# Patient Record
Sex: Female | Born: 1965
Health system: Southern US, Community
[De-identification: ages and names within clinical notes are randomized; demographics above are authoritative.]

## PROBLEM LIST (undated history)

## (undated) DIAGNOSIS — R0600 Dyspnea, unspecified: Secondary | ICD-10-CM

## (undated) DIAGNOSIS — J479 Bronchiectasis, uncomplicated: Secondary | ICD-10-CM

## (undated) DIAGNOSIS — Z8489 Family history of other specified conditions: Secondary | ICD-10-CM

## (undated) DIAGNOSIS — B441 Other pulmonary aspergillosis: Secondary | ICD-10-CM

## (undated) DIAGNOSIS — Z9289 Personal history of other medical treatment: Secondary | ICD-10-CM

## (undated) DIAGNOSIS — G43909 Migraine, unspecified, not intractable, without status migrainosus: Secondary | ICD-10-CM

## (undated) DIAGNOSIS — J189 Pneumonia, unspecified organism: Secondary | ICD-10-CM

## (undated) DIAGNOSIS — A318 Other mycobacterial infections: Secondary | ICD-10-CM

## (undated) HISTORY — PX: COLONOSCOPY: SHX174

## (undated) HISTORY — PX: WISDOM TOOTH EXTRACTION: SHX21

## (undated) HISTORY — DX: Migraine, unspecified, not intractable, without status migrainosus: G43.909

## (undated) HISTORY — DX: Personal history of other medical treatment: Z92.89

## (undated) HISTORY — PX: OTHER SURGICAL HISTORY: SHX169

---
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
## 2005-03-25 ENCOUNTER — Ambulatory Visit: Payer: Self-pay | Admitting: Internal Medicine

## 2013-07-25 DIAGNOSIS — Z9289 Personal history of other medical treatment: Secondary | ICD-10-CM

## 2013-07-25 HISTORY — DX: Personal history of other medical treatment: Z92.89

## 2016-01-29 DIAGNOSIS — D225 Melanocytic nevi of trunk: Secondary | ICD-10-CM | POA: Diagnosis not present

## 2016-08-23 DIAGNOSIS — G43909 Migraine, unspecified, not intractable, without status migrainosus: Secondary | ICD-10-CM

## 2016-08-23 HISTORY — DX: Migraine, unspecified, not intractable, without status migrainosus: G43.909

## 2016-09-16 DIAGNOSIS — Z01419 Encounter for gynecological examination (general) (routine) without abnormal findings: Secondary | ICD-10-CM | POA: Diagnosis not present

## 2016-09-16 DIAGNOSIS — Z1239 Encounter for other screening for malignant neoplasm of breast: Secondary | ICD-10-CM | POA: Diagnosis not present

## 2016-09-16 DIAGNOSIS — Z1211 Encounter for screening for malignant neoplasm of colon: Secondary | ICD-10-CM | POA: Diagnosis not present

## 2016-09-16 DIAGNOSIS — Z3009 Encounter for other general counseling and advice on contraception: Secondary | ICD-10-CM | POA: Diagnosis not present

## 2016-11-26 ENCOUNTER — Other Ambulatory Visit: Payer: Self-pay | Admitting: Obstetrics and Gynecology

## 2017-01-27 DIAGNOSIS — D2372 Other benign neoplasm of skin of left lower limb, including hip: Secondary | ICD-10-CM | POA: Diagnosis not present

## 2017-09-21 DIAGNOSIS — L301 Dyshidrosis [pompholyx]: Secondary | ICD-10-CM | POA: Diagnosis not present

## 2017-09-22 DIAGNOSIS — M47812 Spondylosis without myelopathy or radiculopathy, cervical region: Secondary | ICD-10-CM | POA: Diagnosis not present

## 2017-09-22 DIAGNOSIS — M5412 Radiculopathy, cervical region: Secondary | ICD-10-CM | POA: Diagnosis not present

## 2017-09-29 DIAGNOSIS — M5413 Radiculopathy, cervicothoracic region: Secondary | ICD-10-CM | POA: Diagnosis not present

## 2017-09-29 DIAGNOSIS — M50323 Other cervical disc degeneration at C6-C7 level: Secondary | ICD-10-CM | POA: Diagnosis not present

## 2017-09-29 DIAGNOSIS — M4722 Other spondylosis with radiculopathy, cervical region: Secondary | ICD-10-CM | POA: Diagnosis not present

## 2017-09-29 DIAGNOSIS — M50322 Other cervical disc degeneration at C5-C6 level: Secondary | ICD-10-CM | POA: Diagnosis not present

## 2017-10-03 DIAGNOSIS — M5413 Radiculopathy, cervicothoracic region: Secondary | ICD-10-CM | POA: Diagnosis not present

## 2017-10-03 DIAGNOSIS — M50323 Other cervical disc degeneration at C6-C7 level: Secondary | ICD-10-CM | POA: Diagnosis not present

## 2017-10-03 DIAGNOSIS — M50322 Other cervical disc degeneration at C5-C6 level: Secondary | ICD-10-CM | POA: Diagnosis not present

## 2017-10-03 DIAGNOSIS — M4722 Other spondylosis with radiculopathy, cervical region: Secondary | ICD-10-CM | POA: Diagnosis not present

## 2017-10-06 DIAGNOSIS — M4722 Other spondylosis with radiculopathy, cervical region: Secondary | ICD-10-CM | POA: Diagnosis not present

## 2017-10-06 DIAGNOSIS — M5413 Radiculopathy, cervicothoracic region: Secondary | ICD-10-CM | POA: Diagnosis not present

## 2017-10-06 DIAGNOSIS — M50323 Other cervical disc degeneration at C6-C7 level: Secondary | ICD-10-CM | POA: Diagnosis not present

## 2017-10-06 DIAGNOSIS — M50322 Other cervical disc degeneration at C5-C6 level: Secondary | ICD-10-CM | POA: Diagnosis not present

## 2017-10-14 DIAGNOSIS — M50323 Other cervical disc degeneration at C6-C7 level: Secondary | ICD-10-CM | POA: Diagnosis not present

## 2017-10-14 DIAGNOSIS — M4722 Other spondylosis with radiculopathy, cervical region: Secondary | ICD-10-CM | POA: Diagnosis not present

## 2017-10-14 DIAGNOSIS — M50322 Other cervical disc degeneration at C5-C6 level: Secondary | ICD-10-CM | POA: Diagnosis not present

## 2017-10-14 DIAGNOSIS — M5413 Radiculopathy, cervicothoracic region: Secondary | ICD-10-CM | POA: Diagnosis not present

## 2017-10-19 DIAGNOSIS — M5413 Radiculopathy, cervicothoracic region: Secondary | ICD-10-CM | POA: Diagnosis not present

## 2017-10-19 DIAGNOSIS — M50322 Other cervical disc degeneration at C5-C6 level: Secondary | ICD-10-CM | POA: Diagnosis not present

## 2017-10-19 DIAGNOSIS — M4722 Other spondylosis with radiculopathy, cervical region: Secondary | ICD-10-CM | POA: Diagnosis not present

## 2017-10-19 DIAGNOSIS — M50323 Other cervical disc degeneration at C6-C7 level: Secondary | ICD-10-CM | POA: Diagnosis not present

## 2017-10-25 DIAGNOSIS — M5413 Radiculopathy, cervicothoracic region: Secondary | ICD-10-CM | POA: Diagnosis not present

## 2017-10-25 DIAGNOSIS — M4722 Other spondylosis with radiculopathy, cervical region: Secondary | ICD-10-CM | POA: Diagnosis not present

## 2017-10-25 DIAGNOSIS — M50323 Other cervical disc degeneration at C6-C7 level: Secondary | ICD-10-CM | POA: Diagnosis not present

## 2017-10-25 DIAGNOSIS — M50322 Other cervical disc degeneration at C5-C6 level: Secondary | ICD-10-CM | POA: Diagnosis not present

## 2017-11-04 DIAGNOSIS — M50323 Other cervical disc degeneration at C6-C7 level: Secondary | ICD-10-CM | POA: Diagnosis not present

## 2017-11-04 DIAGNOSIS — M50322 Other cervical disc degeneration at C5-C6 level: Secondary | ICD-10-CM | POA: Diagnosis not present

## 2017-11-04 DIAGNOSIS — M4722 Other spondylosis with radiculopathy, cervical region: Secondary | ICD-10-CM | POA: Diagnosis not present

## 2017-11-04 DIAGNOSIS — M5413 Radiculopathy, cervicothoracic region: Secondary | ICD-10-CM | POA: Diagnosis not present

## 2017-11-18 DIAGNOSIS — M50322 Other cervical disc degeneration at C5-C6 level: Secondary | ICD-10-CM | POA: Diagnosis not present

## 2017-11-18 DIAGNOSIS — M50323 Other cervical disc degeneration at C6-C7 level: Secondary | ICD-10-CM | POA: Diagnosis not present

## 2017-11-18 DIAGNOSIS — M4722 Other spondylosis with radiculopathy, cervical region: Secondary | ICD-10-CM | POA: Diagnosis not present

## 2017-11-18 DIAGNOSIS — M5413 Radiculopathy, cervicothoracic region: Secondary | ICD-10-CM | POA: Diagnosis not present

## 2017-12-08 DIAGNOSIS — M50323 Other cervical disc degeneration at C6-C7 level: Secondary | ICD-10-CM | POA: Diagnosis not present

## 2017-12-08 DIAGNOSIS — M4722 Other spondylosis with radiculopathy, cervical region: Secondary | ICD-10-CM | POA: Diagnosis not present

## 2017-12-08 DIAGNOSIS — M50322 Other cervical disc degeneration at C5-C6 level: Secondary | ICD-10-CM | POA: Diagnosis not present

## 2017-12-08 DIAGNOSIS — M5413 Radiculopathy, cervicothoracic region: Secondary | ICD-10-CM | POA: Diagnosis not present

## 2017-12-14 DIAGNOSIS — Z0000 Encounter for general adult medical examination without abnormal findings: Secondary | ICD-10-CM | POA: Diagnosis not present

## 2017-12-21 DIAGNOSIS — Z0000 Encounter for general adult medical examination without abnormal findings: Secondary | ICD-10-CM | POA: Diagnosis not present

## 2017-12-22 DIAGNOSIS — M4722 Other spondylosis with radiculopathy, cervical region: Secondary | ICD-10-CM | POA: Diagnosis not present

## 2017-12-22 DIAGNOSIS — M5413 Radiculopathy, cervicothoracic region: Secondary | ICD-10-CM | POA: Diagnosis not present

## 2017-12-22 DIAGNOSIS — M50323 Other cervical disc degeneration at C6-C7 level: Secondary | ICD-10-CM | POA: Diagnosis not present

## 2017-12-22 DIAGNOSIS — M50322 Other cervical disc degeneration at C5-C6 level: Secondary | ICD-10-CM | POA: Diagnosis not present

## 2018-01-05 DIAGNOSIS — Z1211 Encounter for screening for malignant neoplasm of colon: Secondary | ICD-10-CM | POA: Diagnosis not present

## 2018-01-05 DIAGNOSIS — Z01818 Encounter for other preprocedural examination: Secondary | ICD-10-CM | POA: Diagnosis not present

## 2018-02-02 DIAGNOSIS — X32XXXA Exposure to sunlight, initial encounter: Secondary | ICD-10-CM | POA: Diagnosis not present

## 2018-02-02 DIAGNOSIS — L814 Other melanin hyperpigmentation: Secondary | ICD-10-CM | POA: Diagnosis not present

## 2018-02-02 DIAGNOSIS — D485 Neoplasm of uncertain behavior of skin: Secondary | ICD-10-CM | POA: Diagnosis not present

## 2018-02-02 DIAGNOSIS — D2372 Other benign neoplasm of skin of left lower limb, including hip: Secondary | ICD-10-CM | POA: Diagnosis not present

## 2018-03-16 DIAGNOSIS — K5730 Diverticulosis of large intestine without perforation or abscess without bleeding: Secondary | ICD-10-CM | POA: Diagnosis not present

## 2018-03-16 DIAGNOSIS — Z1211 Encounter for screening for malignant neoplasm of colon: Secondary | ICD-10-CM | POA: Diagnosis not present

## 2018-03-16 DIAGNOSIS — K640 First degree hemorrhoids: Secondary | ICD-10-CM | POA: Diagnosis not present

## 2018-03-16 DIAGNOSIS — K648 Other hemorrhoids: Secondary | ICD-10-CM | POA: Diagnosis not present

## 2018-04-13 DIAGNOSIS — L814 Other melanin hyperpigmentation: Secondary | ICD-10-CM | POA: Diagnosis not present

## 2018-04-13 DIAGNOSIS — L578 Other skin changes due to chronic exposure to nonionizing radiation: Secondary | ICD-10-CM | POA: Diagnosis not present

## 2018-04-13 DIAGNOSIS — D229 Melanocytic nevi, unspecified: Secondary | ICD-10-CM | POA: Diagnosis not present

## 2018-04-13 DIAGNOSIS — D2272 Melanocytic nevi of left lower limb, including hip: Secondary | ICD-10-CM | POA: Diagnosis not present

## 2018-04-13 DIAGNOSIS — L988 Other specified disorders of the skin and subcutaneous tissue: Secondary | ICD-10-CM | POA: Diagnosis not present

## 2018-06-02 DIAGNOSIS — N390 Urinary tract infection, site not specified: Secondary | ICD-10-CM | POA: Diagnosis not present

## 2018-06-02 DIAGNOSIS — R300 Dysuria: Secondary | ICD-10-CM | POA: Diagnosis not present

## 2018-06-09 ENCOUNTER — Encounter: Payer: Self-pay | Admitting: Obstetrics and Gynecology

## 2018-06-09 ENCOUNTER — Ambulatory Visit (INDEPENDENT_AMBULATORY_CARE_PROVIDER_SITE_OTHER): Payer: BLUE CROSS/BLUE SHIELD | Admitting: Obstetrics and Gynecology

## 2018-06-09 ENCOUNTER — Other Ambulatory Visit (HOSPITAL_COMMUNITY)
Admission: RE | Admit: 2018-06-09 | Discharge: 2018-06-09 | Disposition: A | Discharge status: Home / Self Care | Payer: BLUE CROSS/BLUE SHIELD | Source: Ambulatory Visit | Attending: Obstetrics and Gynecology | Admitting: Obstetrics and Gynecology

## 2018-06-09 VITALS — BP 126/70 | HR 77 | Ht 66.0 in | Wt 132.0 lb

## 2018-06-09 DIAGNOSIS — Z124 Encounter for screening for malignant neoplasm of cervix: Secondary | ICD-10-CM

## 2018-06-09 DIAGNOSIS — N898 Other specified noninflammatory disorders of vagina: Secondary | ICD-10-CM

## 2018-06-09 DIAGNOSIS — Z1151 Encounter for screening for human papillomavirus (HPV): Secondary | ICD-10-CM | POA: Insufficient documentation

## 2018-06-09 DIAGNOSIS — Z1239 Encounter for other screening for malignant neoplasm of breast: Secondary | ICD-10-CM

## 2018-06-09 DIAGNOSIS — Z01411 Encounter for gynecological examination (general) (routine) with abnormal findings: Secondary | ICD-10-CM | POA: Diagnosis not present

## 2018-06-09 DIAGNOSIS — Z01419 Encounter for gynecological examination (general) (routine) without abnormal findings: Secondary | ICD-10-CM | POA: Diagnosis not present

## 2018-06-09 DIAGNOSIS — N951 Menopausal and female climacteric states: Secondary | ICD-10-CM

## 2018-06-09 DIAGNOSIS — Z87440 Personal history of urinary (tract) infections: Secondary | ICD-10-CM

## 2018-06-09 MED ORDER — FLUCONAZOLE 150 MG PO TABS: 150.0000 mg | ORAL_TABLET | Freq: Once | ORAL | 0 refills | Status: AC

## 2018-06-09 NOTE — Progress Notes (Signed)
PCP: Raynelle Bring   Chief Complaint  Patient presents with  . Gynecologic Exam    just finished treatment for UTI, still having some itching and external irritation, no burning    HPI:      [name] is a 52 y.o. No obstetric history on file. who LMP was Patient's last menstrual period was 05/29/2018 (exact date)., presents today for her annual examination.  Her menses are regular every 28-30 days, lasting 4 days.  Dysmenorrhea mild, occurring first 1-2 days of flow. She does not have intermenstrual bleeding. She does not have vasomotor sx. Stopped OCPs 2 yrs ago and doing well. Migraines resolved.   Sex activity: single partner, contraception - condoms. She does not have vaginal dryness.  She has had ext vag irritation for several wks with mild d/c, no odor. Then developed dysuria and treated with macrobid for UTI recently. C&S showed GBS but since pt's sx resolved, not treated with PCN. Pt still has ext irritation. Uses "natural soap". No yeast meds to treat.   Last Pap: June 23, 2015  Results were: ASCUS with NEGATIVE high risk HPV /neg HPV DNA. Repeat due today Hx of STDs: none  Last mammogram: August 01, 2015  Results were: normal--routine follow-up in 12 months There is no FH of breast cancer. There is no FH of ovarian cancer. The patient does do self-breast exams.  Colonoscopy: 8/19  Repeat due after 10 years.   Tobacco use: The patient denies current or previous tobacco use. Alcohol use: none Exercise: moderately active  She does get adequate calcium and Vitamin D in her diet.  Labs with PCP.   Past Medical History:  Diagnosis Date  . History of mammogram 07/25/2013   BIRAD 2; 08/01/15 neg  . Migraine 08/2016   with aura;now also optic migraines    Past Surgical History:  Procedure Laterality Date  . CESAREAN SECTION  1993  . Tummy tuck      Family History  Problem Relation Age of Onset  . Heart disease Father   . Lung cancer  Paternal Grandfather     Social History   Socioeconomic History  . Marital status: Married    Spouse name: Not on file  . Number of children: Not on file  . Years of education: Not on file  . Highest education level: Not on file  Occupational History  . Not on file  Social Needs  . Financial resource strain: Not on file  . Food insecurity:    Worry: Not on file    Inability: Not on file  . Transportation needs:    Medical: Not on file    Non-medical: Not on file  Tobacco Use  . Smoking status: Never Smoker  . Smokeless tobacco: Never Used  Substance and Sexual Activity  . Alcohol use: Yes    Comment: rarely  . Drug use: Never  . Sexual activity: Yes    Birth control/protection: Condom  Lifestyle  . Physical activity:    Days per week: Not on file    Minutes per session: Not on file  . Stress: Not on file  Relationships  . Social connections:    Talks on phone: Not on file    Gets together: Not on file    Attends religious service: Not on file    Active member of club or organization: Not on file    Attends meetings of clubs or organizations: Not on file    Relationship status: Not on file  .  Intimate partner violence:    Fear of current or ex partner: Not on file    Emotionally abused: Not on file    Physically abused: Not on file    Forced sexual activity: Not on file  Other Topics Concern  . Not on file  Social History Narrative  . Not on file    Outpatient Medications Prior to Visit  Medication Sig Dispense Refill  . Cholecalciferol (VITAMIN D-1000 MAX ST) 1000 units tablet Take by mouth.    . Cyanocobalamin 2500 MCG CHEW Chew by mouth.     No facility-administered medications prior to visit.     ROS:  Review of Systems  Constitutional: Negative for fatigue, fever and unexpected weight change.  Respiratory: Negative for cough, shortness of breath and wheezing.   Cardiovascular: Negative for chest pain, palpitations and leg swelling.    Gastrointestinal: Negative for blood in stool, constipation, diarrhea, nausea and vomiting.  Endocrine: Negative for cold intolerance, heat intolerance and polyuria.  Genitourinary: Positive for vaginal discharge. Negative for dyspareunia, dysuria, flank pain, frequency, genital sores, hematuria, menstrual problem, pelvic pain, urgency, vaginal bleeding and vaginal pain.  Musculoskeletal: Negative for back pain, joint swelling and myalgias.  Skin: Negative for rash.  Neurological: Negative for dizziness, syncope, light-headedness, numbness and headaches.  Hematological: Negative for adenopathy.  Psychiatric/Behavioral: Negative for agitation, confusion, sleep disturbance and suicidal ideas. The patient is not nervous/anxious.    BREAST: No symptoms    Objective: BP 126/70   Pulse 77   Ht 5\' 6"  (1.676 m)   Wt 132 lb (59.9 kg)   LMP 05/29/2018 (Exact Date)   BMI 21.31 kg/m    Physical Exam  Constitutional: She is oriented to person, place, and time. She appears well-developed and well-nourished.  Genitourinary: Vagina normal and uterus normal. There is no rash or tenderness on the right labia. There is no rash or tenderness on the left labia. No erythema or tenderness in the vagina. No vaginal discharge found. Right adnexum does not display mass and does not display tenderness. Left adnexum does not display mass and does not display tenderness. Cervix does not exhibit motion tenderness or polyp. Uterus is not enlarged or tender.  Neck: Normal range of motion. No thyromegaly present.  Cardiovascular: Normal rate, regular rhythm and normal heart sounds.  No murmur heard. Pulmonary/Chest: Effort normal and breath sounds normal. Right breast exhibits no mass, no nipple discharge, no skin change and no tenderness. Left breast exhibits no mass, no nipple discharge, no skin change and no tenderness.  Abdominal: Soft. There is no tenderness. There is no guarding.  Musculoskeletal: Normal range  of motion.  Neurological: She is alert and oriented to person, place, and time. No cranial nerve deficit.  Psychiatric: She has a normal mood and affect. Her behavior is normal.  Vitals reviewed.   Assessment/Plan:  Encounter for annual routine gynecological examination  Cervical cancer screening - Plan: Cytology - PAP  Screening for HPV (human papillomavirus) - Plan: Cytology - PAP  Screening for breast cancer - Pt to scehd mammo - Plan: MM 3D SCREEN BREAST BILATERAL  Vaginal itching - Neg exam. Rx diflucan empirically since on abx recently. Dove sens skin soap. - Plan: fluconazole (DIFLUCAN) 150 MG tablet  Hx: UTI (urinary tract infection) - Pt had GBS on C&S. Treated with macrobid with sx relief. If sx recur, will treat with PCN.   Perimenopause - F/u prn DUB   Meds ordered this encounter  Medications  . fluconazole (DIFLUCAN) 150  MG tablet    Sig: Take 1 tablet (150 mg total) by mouth once for 1 dose.    Dispense:  1 tablet    Refill:  0    Order Specific Question:   Supervising Provider    Answer:   Nadara Mustard [[id number]]            GYN counsel breast self exam, mammography screening, menopause, adequate intake of calcium and vitamin D, diet and exercise    F/U  Return in about 1 year (around 06/10/2019).  Alix Stowers B. Anyely Cunning, PA-C 06/09/2018 12:14 PM

## 2018-06-09 NOTE — Patient Instructions (Signed)
I value your feedback and entrusting us with your care. If you get a [facility] patient survey, I would appreciate you taking the time to let us know about your experience today. Thank you! 

## 2018-06-14 LAB — CYTOLOGY - PAP
Diagnosis: NEGATIVE
HPV (WINDOPATH): NOT DETECTED

## 2018-06-23 DIAGNOSIS — J479 Bronchiectasis, uncomplicated: Secondary | ICD-10-CM

## 2018-06-23 HISTORY — DX: Bronchiectasis, uncomplicated: J47.9

## 2018-07-05 ENCOUNTER — Ambulatory Visit
Admission: RE | Admit: 2018-07-05 | Discharge: 2018-07-05 | Disposition: A | Discharge status: Home / Self Care | Payer: BLUE CROSS/BLUE SHIELD | Source: Ambulatory Visit | Attending: Obstetrics and Gynecology | Admitting: Obstetrics and Gynecology

## 2018-07-05 DIAGNOSIS — Z1231 Encounter for screening mammogram for malignant neoplasm of breast: Secondary | ICD-10-CM | POA: Diagnosis not present

## 2018-07-05 DIAGNOSIS — Z1239 Encounter for other screening for malignant neoplasm of breast: Secondary | ICD-10-CM | POA: Diagnosis not present

## 2018-07-11 ENCOUNTER — Encounter: Payer: Self-pay | Admitting: Obstetrics and Gynecology

## 2018-07-12 DIAGNOSIS — J209 Acute bronchitis, unspecified: Secondary | ICD-10-CM | POA: Diagnosis not present

## 2018-08-08 DIAGNOSIS — J189 Pneumonia, unspecified organism: Secondary | ICD-10-CM | POA: Diagnosis not present

## 2018-08-09 ENCOUNTER — Ambulatory Visit
Admission: RE | Admit: 2018-08-09 | Discharge: 2018-08-09 | Disposition: A | Discharge status: Home / Self Care | Payer: BLUE CROSS/BLUE SHIELD | Source: Ambulatory Visit | Attending: Family Medicine | Admitting: Family Medicine

## 2018-08-09 ENCOUNTER — Other Ambulatory Visit: Payer: Self-pay | Admitting: Family Medicine

## 2018-08-09 DIAGNOSIS — R918 Other nonspecific abnormal finding of lung field: Secondary | ICD-10-CM | POA: Diagnosis not present

## 2018-08-09 DIAGNOSIS — J189 Pneumonia, unspecified organism: Secondary | ICD-10-CM | POA: Diagnosis not present

## 2018-08-09 DIAGNOSIS — J984 Other disorders of lung: Secondary | ICD-10-CM

## 2018-08-09 MED ORDER — IOHEXOL 300 MG/ML  SOLN
75.0000 mL | Freq: Once | INTRAMUSCULAR | Status: AC | PRN
  Administered 2018-08-09: 75 mL via INTRAVENOUS

## 2018-08-10 ENCOUNTER — Other Ambulatory Visit: Payer: Self-pay | Admitting: Family Medicine

## 2018-08-10 DIAGNOSIS — R160 Hepatomegaly, not elsewhere classified: Secondary | ICD-10-CM

## 2018-08-10 DIAGNOSIS — K7689 Other specified diseases of liver: Secondary | ICD-10-CM

## 2018-08-15 ENCOUNTER — Other Ambulatory Visit: Payer: Self-pay | Admitting: Family Medicine

## 2018-08-15 DIAGNOSIS — K769 Liver disease, unspecified: Secondary | ICD-10-CM

## 2018-08-15 DIAGNOSIS — R160 Hepatomegaly, not elsewhere classified: Secondary | ICD-10-CM

## 2018-08-17 DIAGNOSIS — R05 Cough: Secondary | ICD-10-CM | POA: Diagnosis not present

## 2018-08-25 ENCOUNTER — Other Ambulatory Visit: Payer: Self-pay

## 2018-08-25 ENCOUNTER — Ambulatory Visit
Admission: RE | Admit: 2018-08-25 | Discharge: 2018-08-25 | Disposition: A | Discharge status: Home / Self Care | Payer: BLUE CROSS/BLUE SHIELD | Attending: Pulmonary Disease | Admitting: Pulmonary Disease

## 2018-08-25 ENCOUNTER — Encounter: Payer: Self-pay | Admitting: Certified Registered"

## 2018-08-25 ENCOUNTER — Encounter
Admission: RE | Disposition: A | Discharge status: Home / Self Care | Payer: Self-pay | Source: Home / Self Care | Attending: Pulmonary Disease

## 2018-08-25 ENCOUNTER — Encounter: Payer: Self-pay | Admitting: *Deleted

## 2018-08-25 DIAGNOSIS — K769 Liver disease, unspecified: Secondary | ICD-10-CM | POA: Diagnosis not present

## 2018-08-25 DIAGNOSIS — J479 Bronchiectasis, uncomplicated: Secondary | ICD-10-CM | POA: Insufficient documentation

## 2018-08-25 DIAGNOSIS — R071 Chest pain on breathing: Secondary | ICD-10-CM | POA: Diagnosis not present

## 2018-08-25 DIAGNOSIS — J180 Bronchopneumonia, unspecified organism: Secondary | ICD-10-CM | POA: Diagnosis not present

## 2018-08-25 DIAGNOSIS — Z79899 Other long term (current) drug therapy: Secondary | ICD-10-CM | POA: Diagnosis not present

## 2018-08-25 DIAGNOSIS — I700 Atherosclerosis of aorta: Secondary | ICD-10-CM | POA: Diagnosis not present

## 2018-08-25 HISTORY — PX: FLEXIBLE BRONCHOSCOPY: SHX5094

## 2018-08-25 LAB — POCT PREGNANCY, URINE: Preg Test, Ur: NEGATIVE

## 2018-08-25 SURGERY — BRONCHOSCOPY, FLEXIBLE
Anesthesia: Moderate Sedation | Laterality: Bilateral

## 2018-08-25 MED ORDER — LIDOCAINE HCL URETHRAL/MUCOSAL 2 % EX GEL
1.0000 "application " | Freq: Once | CUTANEOUS | Status: DC
  Filled 2018-08-25: qty 30

## 2018-08-25 MED ORDER — FENTANYL CITRATE (PF) 100 MCG/2ML IJ SOLN
INTRAMUSCULAR | Status: AC
  Filled 2018-08-25: qty 4

## 2018-08-25 MED ORDER — MIDAZOLAM HCL 2 MG/2ML IJ SOLN
INTRAMUSCULAR | Status: AC
  Filled 2018-08-25: qty 4

## 2018-08-25 MED ORDER — BUTAMBEN-TETRACAINE-BENZOCAINE 2-2-14 % EX AERO
1.0000 | INHALATION_SPRAY | Freq: Once | CUTANEOUS | Status: DC
  Filled 2018-08-25: qty 20

## 2018-08-25 MED ORDER — LIDOCAINE HCL (PF) 1 % IJ SOLN
30.0000 mL | Freq: Once | INTRAMUSCULAR | Status: DC
  Filled 2018-08-25: qty 30

## 2018-08-25 MED ORDER — FENTANYL CITRATE (PF) 100 MCG/2ML IJ SOLN
INTRAMUSCULAR | Status: AC
  Filled 2018-08-25: qty 2

## 2018-08-25 MED ORDER — SODIUM CHLORIDE 0.9 % IV SOLN: INTRAVENOUS | Status: DC | PRN

## 2018-08-25 MED ORDER — PHENYLEPHRINE HCL 0.25 % NA SOLN
1.0000 | Freq: Four times a day (QID) | NASAL | Status: DC | PRN
  Filled 2018-08-25: qty 15

## 2018-08-25 MED ORDER — MIDAZOLAM HCL 2 MG/2ML IJ SOLN
INTRAMUSCULAR | Status: DC | PRN
  Administered 2018-08-25 (×8): 1 mg via INTRAVENOUS
  Administered 2018-08-25: 2 mg via INTRAVENOUS

## 2018-08-25 MED ORDER — MIDAZOLAM HCL 2 MG/2ML IJ SOLN
INTRAMUSCULAR | Status: AC
  Filled 2018-08-25: qty 10

## 2018-08-25 MED ORDER — FENTANYL CITRATE (PF) 100 MCG/2ML IJ SOLN
INTRAMUSCULAR | Status: DC | PRN
  Administered 2018-08-25 (×2): 25 ug via INTRAVENOUS
  Administered 2018-08-25: 50 ug via INTRAVENOUS
  Administered 2018-08-25 (×5): 25 ug via INTRAVENOUS

## 2018-08-25 NOTE — Discharge Instructions (Signed)
Flexible Bronchoscopy, Care After This sheet gives you information about how to care for yourself after your test. Your doctor may also give you more specific instructions. If you have problems or questions, contact your doctor. Follow these instructions at home: Eating and drinking  Do not eat or drink anything (not even water) for 2 hours after your test, or until your numbing medicine (local anesthetic) wears off.  When your numbness is gone and your cough and gag reflexes have come back, you may: ? Eat only soft foods. ? Slowly drink liquids.  The day after the test, go back to your normal diet. Driving  Do not drive for 24 hours if you were given a medicine to help you relax (sedative).  Do not drive or use heavy machinery while taking prescription pain medicine. General instructions   Take over-the-counter and prescription medicines only as told by your doctor.  Return to your normal activities as told. Ask what activities are safe for you.  Do not use any products that have nicotine or tobacco in them. This includes cigarettes and e-cigarettes. If you need help quitting, ask your doctor.  Keep all follow-up visits as told by your doctor. This is important. It is very important if you had a tissue sample (biopsy) taken. Get help right away if:  You have shortness of breath that gets worse.  You get light-headed.  You feel like you are going to pass out (faint).  You have chest pain.  You cough up: ? More than a little blood. ? More blood than before. Summary  Do not eat or drink anything (not even water) for 2 hours after your test, or until your numbing medicine wears off.  Do not use cigarettes. Do not use e-cigarettes.  Get help right away if you have chest pain. This information is not intended to replace advice given to you by your health care provider. Make sure you discuss any questions you have with your health care provider. Document Released: 06/06/2009  Document Revised: 08/27/2016 Document Reviewed: 08/27/2016 Elsevier Interactive Patient Education  2019 [place]   1) The drugs that you were given will stay in your system until tomorrow so for the next 24 hours you should not:  A) Drive an automobile B) Make any legal decisions C) Drink any alcoholic beverage   2) You may resume regular meals tomorrow.  Today it is better to start with liquids and gradually work up to solid foods.  You may eat anything you prefer, but it is better to start with liquids, then soup and crackers, and gradually work up to solid foods.   3) Please notify your doctor immediately if you have any unusual bleeding, trouble breathing, redness and pain at the surgery site, drainage, fever, or pain not relieved by medication.    4) Additional Instructions:        Please contact your physician with any problems or Same Day Surgery at [phone], Monday through Friday 6 am to 4 pm, or [place] at [facility] number at [phone].

## 2018-08-25 NOTE — Procedures (Addendum)
FLEXIBLE BRONCHOSCOPY PROCEDURE NOTE    Flexible bronchoscopy was performed on 08/25/2017  by : Lanney Gins MD  assistance by : 1) Stacy RT  and 2)Lissa RN   Indication for the procedure was :  Pre-procedural H&P. The following assessment was performed on the day of the procedure prior to initiating sedation History:  Chest pain none Dyspnea none Hemoptysis none Cough yes Fever none Other pertinent items none  Examination Vital signs -reviewed as per nursing documentation today Cardiac    Murmurs: none  Rubs : none  Gallop: none Lungs Wheezing: none Rales : none Rhonchi :none  Other pertinent findings: none   Pre-procedural assessment for Procedural Sedation included: Depth of sedation: As per anesthesia team  ASA Classification:  1 Mallampati airway assessment: 2    Medication list reviewed: yes  The patient's interval history was taken and revealed: no new complaints The pre- procedure physical examination revealed: No new findings Refer to prior clinic note for details.  Informed Consent: Informed consent was obtained from:  patient after explanation of procedure and risks, benefits, as well as alternative procedures available.  Explanation of level of sedation and possible transfusion was also provided.    Procedural Preparation: Time out was performed and patient was identified by name and birthdate and procedure to be performed and side for sampling, if any, was specified. Pt was intubated by anesthesia.  The patient was appropriately draped.  Procedure Findings: Bronchoscope was inserted via ETT  without difficulty.  Posterior oropharynx, epiglottis, arytenoids, false cords and vocal cords were not visualized as these were bypassed by endotracheal tube.   The distal trachea was normal in circumference and appearance without mucosal, cartilaginous or branching abnormalities.  The main carina was normal nonsplayed .   All right and left lobar airways  were visualized to the Subsegmental level.  Sub- sub segmental carinae were identified in all the distal airways.   Secretions were visible in the following airways and appeared to be mucopurulent at RML , cloudy at superior and inferior lingular .  The mucosa was : normal  Airways were notable for:        exophytic lesions :none       extrinsic compression in the following distributions: none.       Friable mucosa: mild       Anthrocotic material /pigmentation: none   Pictorial documentation attached: yes      Specimens obtained included:  Bronchial washings site: bilateral  sent for: AFB                                    Broncho-alveolar lavage site:RML and lingular  sent for micro and cytology                             180 ml volume infused 60 ml volume returned with mucopurulent appearance   Immediate sampling complications included:none Epinephrine 25m was used topically  The bronchoscopy was terminated due to completion of the planned procedure and the bronchoscope was removed.   Total dosage of Lidocaine was 4 1%mg Total fluoroscopy time was 0 minutes  Supplemental oxygen was provided at 10 lpm by nasal canula post operatively  Moderate sedation used: Fentanyl 225 mcg,  Versed 10 mg  Estimated Blood loss: 1-5 cc.  Complications included:  none   Disposition: Home with husband  Follow up with Dr.  Raylon Lamson in 10 days for result discussion.     Claudette Stapler MD  [facility] Division of Pulmonary & Critical Care Medicine

## 2018-08-25 NOTE — H&P (Signed)
Pulmonary Medicine H&P         Date: 08/25/2018,   MRN# [id number] Brittney Bailey [birth year]     Admission                  Current  Brittney Bailey is a 53 y.o. old female seen in hospital for procedure post comprehensive evaluation in office     CHIEF COMPLAINT:   Pleuritic chest pain and cough   HISTORY OF PRESENT ILLNESS   This is a pleasant 53yo female who is overall healthy, she is a lifelong nonsmoker.  She was seen by primary care for pneumonia.  She was treated outpatient with typical antimicrobial regimen for CAP but failed therapy.  Patient relates worsening chest pain that is exacerbated with deep breathing and now is causing distress at night when sleeping on side.  She has had cough throuhout this period.  She denies exposure to TB/sick contacts other then common cold.  She was sent to pulmonary evaluation after failing treatment on multiple occasions.  Evaluation of CT chest was significant for bilateral bronchiectaisis worse in right middle lobe and lingular segments.  She was tested for immunoglobulin deficiency and testing was in reference range.  There is concern for active nontubercular mycobacterial infection vs indolent endemic infection and plan is to obtain washings and bronchoalveolar lavage of involved pulmonary segments.     PAST MEDICAL HISTORY   Past Medical History:  Diagnosis Date  . History of mammogram 07/25/2013   BIRAD 2; 08/01/15 neg  . Migraine 08/2016   with aura;now also optic migraines     SURGICAL HISTORY   Past Surgical History:  Procedure Laterality Date  . CESAREAN SECTION  1993  . Tummy tuck       FAMILY HISTORY   Family History  Problem Relation Age of Onset  . Heart disease Father   . Lung cancer Paternal Grandfather      SOCIAL HISTORY   Social History   Tobacco Use  . Smoking status: Never Smoker  . Smokeless tobacco: Never Used  Substance Use Topics  . Alcohol use: Yes    Comment: rarely  . Drug  use: Never     MEDICATIONS    Home Medication:    Current Medication:  Current Facility-Administered Medications:  .  0.9 %  sodium chloride infusion, , Intravenous, PRN, Lanney Gins, Lillith Mcneff, MD .  butamben-tetracaine-benzocaine (CETACAINE) spray 1 spray, 1 spray, Topical, Once, Hodan Wurtz, MD .  lidocaine (PF) (XYLOCAINE) 1 % injection 30 mL, 30 mL, Other, Once, Upton Russey, MD .  lidocaine (XYLOCAINE) 2 % jelly 1 application, 1 application, Topical, Once, Tauno Falotico, MD .  phenylephrine (NEO-SYNEPHRINE) 0.25 % nasal spray 1 spray, 1 spray, Each Nare, Q6H PRN, Ottie Glazier, MD    ALLERGIES   Patient has no known allergies.     REVIEW OF SYSTEMS    Review of Systems:  Gen:  Denies  fever, sweats, chills weigh loss  HEENT: Denies blurred vision, double vision, ear pain, eye pain, hearing loss, nose bleeds, sore throat Cardiac:  No dizziness, chest pain or heaviness, chest tightness,edema Resp:   Denies cough or sputum porduction, shortness of breath,wheezing, hemoptysis,  Gi: Denies swallowing difficulty, stomach pain, nausea or vomiting, diarrhea, constipation, bowel incontinence Gu:  Denies bladder incontinence, burning urine Ext:   Denies Joint pain, stiffness or swelling Skin: Denies  skin rash, easy bruising or bleeding or hives Endoc:  Denies polyuria, polydipsia , polyphagia or weight change  Psych:   Denies depression, insomnia or hallucinations   Other:  All other systems negative   VS: LMP 08/23/2018      PHYSICAL EXAM  Physical Examination:   GENERAL:NAD, no fevers, chills, no weakness no fatigue HEAD: Normocephalic, atraumatic.  EYES: Pupils equal, round, reactive to light. Extraocular muscles intact. No scleral icterus.  MOUTH: Moist mucosal membrane. Dentition intact. No abscess noted.  EAR, NOSE, THROAT: Clear without exudates. No external lesions.  NECK: Supple. No thyromegaly. No nodules. No JVD.  PULMONARY: Clear to auscultation  no wheezing rhonchi or crackles CARDIOVASCULAR: S1 and S2. Regular rate and rhythm. No murmurs, rubs, or gallops. No edema. Pedal pulses 2+ bilaterally.  GASTROINTESTINAL: Soft, nontender, nondistended. No masses. Positive bowel sounds. No hepatosplenomegaly.  MUSCULOSKELETAL: No swelling, clubbing, or edema. Range of motion full in all extremities.  NEUROLOGIC: Cranial nerves II through XII are intact. No gross focal neurological deficits. Sensation intact. Reflexes intact.  SKIN: No ulceration, lesions, rashes, or cyanosis. Skin warm and dry. Turgor intact.  PSYCHIATRIC: Mood, affect within normal limits. The patient is awake, alert and oriented x 3. Insight, judgment intact.       IMAGING    Ct Chest W Contrast  Result Date: 08/09/2018 CLINICAL DATA:  Cough for 3 weeks, chest pain for 2 days. Recent diagnosis of pneumonia. EXAM: CT CHEST WITH CONTRAST TECHNIQUE: Multidetector CT imaging of the chest was performed during intravenous contrast administration. CONTRAST:  16m OMNIPAQUE IOHEXOL 300 MG/ML  SOLN COMPARISON:  CT chest March 25, 2005 FINDINGS: CARDIOVASCULAR: Heart and pericardium are unremarkable. Thoracic aorta is normal course and caliber, trace calcific atherosclerosis. MEDIASTINUM/NODES: New 2 cm posterior mediastinal cystic mass. LUNGS/PLEURA: Tracheobronchial tree is patent, no pneumothorax. Diffuse tree-in-bud infiltrates with basilar predominance. Superimposed subpleural patchy consolidation RIGHT lower lobe. RIGHT middle and lingular chronic atelectasis and bronchiectasis. UPPER ABDOMEN: Nonacute. 19 mm irregular hypodense mass segment 5/6 of the liver. MUSCULOSKELETAL: Nonacute. IMPRESSION: 1. Tree-in-bud infiltrates with superimposed RIGHT middle lobe consolidation, likely infectious. New 2 cm posterior mediastinal cyst, differential diagnosis includes perineural cyst or lymphocele. 2. RIGHT middle lobe and lingular chronic atelectasis and bronchiectasis. 3. **An incidental  finding of potential clinical significance has been found. 19 mm lesion RIGHT lobe of the liver incompletely evaluated. Consider non emergent MRI of the liver. This recommendation follows ACR consensus guidelines: Management of Incidental Liver Lesions on CT: A White Paper of the ACR Incidental Findings Committee. J Am Coll Radiol 2017;14:1429-1437.** 4. These results will be called to the ordering clinician or representative by the Radiology Department at the imaging location. Aortic Atherosclerosis (ICD10-I70.0). Electronically Signed   By: CElon AlasM.D.   On: 08/09/2018 17:55      ASSESSMENT/PLAN   Pleuritic chest pain and bronchiectasis - concern for active indolent infection likely mycobacterium avium complex vs other indolent infection vs inflammatory/malignant etiology - Flexible bronchoscopy with BAL and bronchial washings of involved segments  - invasive specimen to be sent to micro/cyto    Patient/Family are satisfied with Plan of action and management. All questions answered      FOttie Glazier MD Division of Pulmonary and Critical Care Medicine 12:32 PM 08/25/2018

## 2018-08-28 ENCOUNTER — Encounter: Payer: Self-pay | Admitting: Pulmonary Disease

## 2018-08-28 LAB — CYTOLOGY - NON PAP

## 2018-08-28 LAB — CULTURE, BAL-QUANTITATIVE

## 2018-08-28 LAB — CULTURE, BAL-QUANTITATIVE W GRAM STAIN
Culture: >=100000 — AB
Special Requests: NORMAL

## 2018-08-29 ENCOUNTER — Ambulatory Visit: Admission: RE | Admit: 2018-08-29 | Payer: BLUE CROSS/BLUE SHIELD | Source: Ambulatory Visit

## 2018-08-29 ENCOUNTER — Ambulatory Visit
Admission: RE | Admit: 2018-08-29 | Discharge: 2018-08-29 | Disposition: A | Discharge status: Home / Self Care | Payer: BLUE CROSS/BLUE SHIELD | Source: Ambulatory Visit | Attending: Family Medicine | Admitting: Family Medicine

## 2018-08-29 DIAGNOSIS — K7689 Other specified diseases of liver: Secondary | ICD-10-CM | POA: Diagnosis not present

## 2018-08-29 DIAGNOSIS — K769 Liver disease, unspecified: Secondary | ICD-10-CM

## 2018-08-29 DIAGNOSIS — N281 Cyst of kidney, acquired: Secondary | ICD-10-CM | POA: Diagnosis not present

## 2018-08-29 DIAGNOSIS — R160 Hepatomegaly, not elsewhere classified: Secondary | ICD-10-CM

## 2018-08-29 MED ORDER — GADOBENATE DIMEGLUMINE 529 MG/ML IV SOLN
12.0000 mL | Freq: Once | INTRAVENOUS | Status: AC | PRN
  Administered 2018-08-29: 12 mL via INTRAVENOUS

## 2018-08-31 DIAGNOSIS — D235 Other benign neoplasm of skin of trunk: Secondary | ICD-10-CM | POA: Diagnosis not present

## 2018-08-31 DIAGNOSIS — D485 Neoplasm of uncertain behavior of skin: Secondary | ICD-10-CM | POA: Diagnosis not present

## 2018-09-01 LAB — ACID FAST SMEAR (AFB, MYCOBACTERIA): Acid Fast Smear: NEGATIVE

## 2018-09-19 DIAGNOSIS — A310 Pulmonary mycobacterial infection: Secondary | ICD-10-CM | POA: Diagnosis not present

## 2018-09-20 LAB — MAC SUSCEPTIBILITY BROTH
Amikacin: 8
Ciprofloxacin: >16
Clarithromycin: 4
Ethambutol: 8
Linezolid: 32
Moxifloxacin: 1

## 2018-09-20 LAB — AFB ORGANISM ID BY DNA PROBE
M avium complex: 538 — AB
M tuberculosis complex: NEGATIVE

## 2018-09-20 LAB — ACID FAST CULTURE WITH REFLEXED SENSITIVITIES (MYCOBACTERIA): Acid Fast Culture: POSITIVE — AB

## 2018-10-03 LAB — ORGANISM IDENTIFICATION, MOLD

## 2018-10-03 LAB — MOLD ORGANISM REFLEX

## 2018-10-06 LAB — CULTURE, FUNGUS WITHOUT SMEAR: Special Requests: NORMAL

## 2018-10-09 LAB — RHEUMATOID FACTORS, FLUID

## 2018-11-03 LAB — MISC LABCORP TEST (SEND OUT)
Labcorp test code: 818386
Labcorp test code: 818389
Labcorp test code: 818395

## 2018-11-16 DIAGNOSIS — A310 Pulmonary mycobacterial infection: Secondary | ICD-10-CM | POA: Diagnosis not present

## 2018-12-25 DIAGNOSIS — Z131 Encounter for screening for diabetes mellitus: Secondary | ICD-10-CM | POA: Diagnosis not present

## 2018-12-25 DIAGNOSIS — Z0000 Encounter for general adult medical examination without abnormal findings: Secondary | ICD-10-CM | POA: Diagnosis not present

## 2019-01-08 ENCOUNTER — Encounter: Payer: Self-pay | Admitting: Anesthesiology

## 2019-01-19 DIAGNOSIS — H8113 Benign paroxysmal vertigo, bilateral: Secondary | ICD-10-CM | POA: Diagnosis not present

## 2019-02-05 DIAGNOSIS — D2372 Other benign neoplasm of skin of left lower limb, including hip: Secondary | ICD-10-CM | POA: Diagnosis not present

## 2019-02-15 DIAGNOSIS — B441 Other pulmonary aspergillosis: Secondary | ICD-10-CM | POA: Diagnosis not present

## 2019-02-15 DIAGNOSIS — A310 Pulmonary mycobacterial infection: Secondary | ICD-10-CM | POA: Diagnosis not present

## 2019-03-01 DIAGNOSIS — B441 Other pulmonary aspergillosis: Secondary | ICD-10-CM | POA: Diagnosis not present

## 2019-04-12 DIAGNOSIS — J479 Bronchiectasis, uncomplicated: Secondary | ICD-10-CM | POA: Diagnosis not present

## 2019-05-18 DIAGNOSIS — Z23 Encounter for immunization: Secondary | ICD-10-CM | POA: Diagnosis not present

## 2019-05-18 DIAGNOSIS — A310 Pulmonary mycobacterial infection: Secondary | ICD-10-CM | POA: Diagnosis not present

## 2019-05-21 ENCOUNTER — Other Ambulatory Visit: Payer: Self-pay | Admitting: Infectious Diseases

## 2019-05-21 DIAGNOSIS — A310 Pulmonary mycobacterial infection: Secondary | ICD-10-CM

## 2019-05-30 ENCOUNTER — Ambulatory Visit: Payer: BC Managed Care – PPO

## 2019-06-01 ENCOUNTER — Other Ambulatory Visit: Payer: Self-pay

## 2019-06-01 DIAGNOSIS — Z20822 Contact with and (suspected) exposure to covid-19: Secondary | ICD-10-CM

## 2019-06-02 LAB — NOVEL CORONAVIRUS, NAA: SARS-CoV-2, NAA: NOT DETECTED

## 2019-06-04 DIAGNOSIS — J479 Bronchiectasis, uncomplicated: Secondary | ICD-10-CM | POA: Diagnosis not present

## 2019-06-12 ENCOUNTER — Other Ambulatory Visit: Payer: Self-pay | Admitting: Infectious Diseases

## 2019-06-12 ENCOUNTER — Ambulatory Visit: Payer: BLUE CROSS/BLUE SHIELD | Admitting: Obstetrics and Gynecology

## 2019-06-12 DIAGNOSIS — A310 Pulmonary mycobacterial infection: Secondary | ICD-10-CM

## 2019-06-18 ENCOUNTER — Ambulatory Visit
Admission: RE | Admit: 2019-06-18 | Discharge: 2019-06-18 | Disposition: A | Discharge status: Home / Self Care | Payer: BC Managed Care – PPO | Source: Ambulatory Visit | Attending: Infectious Diseases | Admitting: Infectious Diseases

## 2019-06-18 DIAGNOSIS — A310 Pulmonary mycobacterial infection: Secondary | ICD-10-CM

## 2019-06-18 DIAGNOSIS — J479 Bronchiectasis, uncomplicated: Secondary | ICD-10-CM | POA: Diagnosis not present

## 2019-06-20 NOTE — Patient Instructions (Signed)
I value your feedback and entrusting us with your care. If you get a  patient survey, I would appreciate you taking the time to let us know about your experience today. Thank you! 

## 2019-06-20 NOTE — Progress Notes (Signed)
PCP: Medicine, [facility]   Chief Complaint  Patient presents with  . Gynecologic Exam    HPI:      [name] is a 53 y.o. No obstetric history on file. who LMP was Patient's last menstrual period was 06/10/2019 (exact date)., presents today for her annual examination.  Her menses are infrequent due to perimenopause, lasting 6 days.  Dysmenorrhea none. She does not have intermenstrual bleeding. She has occas vasomotor sx. Stopped OCPs a few yrs ago and doing well.    Sex activity: single partner. She does not have vaginal dryness.  Last Pap: 06/09/18  Results were: no abnormalities /neg HPV DNA.  Hx of STDs: none  Last mammogram: 07/05/18  Results were: normal--routine follow-up in 12 months There is a FH of breast cancer in her PGGM, genetic testing not indicated. There is no FH of ovarian cancer. The patient does do self-breast exams.  Colonoscopy: 8/19  Repeat due after 10 years.   Tobacco use: The patient denies current or previous tobacco use. Alcohol use: rare No drug use Exercise: moderately active  She does get adequate calcium and Vitamin D in her diet.  Labs with PCP. Diagnosed with bronchiectasis due to MAC.    Past Medical History:  Diagnosis Date  . Bronchiectasis ([place]) 06/2018  . History of mammogram 07/25/2013   BIRAD 2; 08/01/15 neg  . Migraine 08/2016   with aura;now also optic migraines    Past Surgical History:  Procedure Laterality Date  . CESAREAN SECTION  1993  . FLEXIBLE BRONCHOSCOPY Bilateral 08/25/2018   Procedure: FLEXIBLE BRONCHOSCOPY WITH BAL;  Surgeon: Ottie Glazier, MD;  Location: [facility] ORS;  Service: Thoracic;  Laterality: Bilateral;  . Tummy tuck      Family History  Problem Relation Age of Onset  . Heart disease Father   . Lung cancer Paternal Grandfather     Social History   Socioeconomic History  . Marital status: Married    Spouse name: Not on file  . Number of children: Not on file  . Years of  education: Not on file  . Highest education level: Not on file  Occupational History  . Not on file  Social Needs  . Financial resource strain: Not on file  . Food insecurity    Worry: Not on file    Inability: Not on file  . Transportation needs    Medical: Not on file    Non-medical: Not on file  Tobacco Use  . Smoking status: Never Smoker  . Smokeless tobacco: Never Used  Substance and Sexual Activity  . Alcohol use: Yes    Comment: rarely  . Drug use: Never  . Sexual activity: Yes    Birth control/protection: Condom  Lifestyle  . Physical activity    Days per week: Not on file    Minutes per session: Not on file  . Stress: Not on file  Relationships  . Social Herbalist on phone: Not on file    Gets together: Not on file    Attends religious service: Not on file    Active member of club or organization: Not on file    Attends meetings of clubs or organizations: Not on file    Relationship status: Not on file  . Intimate partner violence    Fear of current or ex partner: Not on file    Emotionally abused: Not on file    Physically abused: Not on file    Forced  sexual activity: Not on file  Other Topics Concern  . Not on file  Social History Narrative  . Not on file    Outpatient Medications Prior to Visit  Medication Sig Dispense Refill  . albuterol (ACCUNEB) 1.25 MG/3ML nebulizer solution Inhale into the lungs.    . Cholecalciferol (VITAMIN D3 GUMMIES ADULT PO) Take 2 tablets by mouth daily.    . Cyanocobalamin (CVS B12 GUMMIES PO) Take 2 tablets by mouth daily.    . Sodium Chloride, Inhalant, 7 % NEBU SMARTSIG:1 Vial(s) Via Inhaler 4 Times Daily    . Spacer/Aero-Holding Chambers (AEROCHAMBER W/FLOWSIGNAL) inhaler Use as instructed.    Marland Kitchen azithromycin (ZITHROMAX) 250 MG tablet Take by mouth.     No facility-administered medications prior to visit.     ROS:  Review of Systems  Constitutional: Negative for fatigue, fever and unexpected weight  change.  Respiratory: Negative for cough, shortness of breath and wheezing.   Cardiovascular: Negative for chest pain, palpitations and leg swelling.  Gastrointestinal: Negative for blood in stool, constipation, diarrhea, nausea and vomiting.  Endocrine: Negative for cold intolerance, heat intolerance and polyuria.  Genitourinary: Negative for dyspareunia, dysuria, flank pain, frequency, genital sores, hematuria, menstrual problem, pelvic pain, urgency, vaginal bleeding, vaginal discharge and vaginal pain.  Musculoskeletal: Negative for back pain, joint swelling and myalgias.  Skin: Negative for rash.  Neurological: Negative for dizziness, syncope, light-headedness, numbness and headaches.  Hematological: Negative for adenopathy.  Psychiatric/Behavioral: Negative for agitation, confusion, sleep disturbance and suicidal ideas. The patient is not nervous/anxious.    BREAST: No symptoms    Objective: BP 120/78   Ht _0  (1.702 m)   Wt 134 lb (60.8 kg)   LMP 06/10/2019 (Exact Date)   BMI 20.99 kg/m    Physical Exam Constitutional:      Appearance: She is well-developed.  Genitourinary:     Vulva, vagina, uterus, right adnexa and left adnexa normal.     No vulval lesion or tenderness noted.     No vaginal discharge, erythema or tenderness.     No cervical motion tenderness or polyp.     Uterus is not enlarged or tender.     No right or left adnexal mass present.     Right adnexa not tender.     Left adnexa not tender.  Neck:     Musculoskeletal: Normal range of motion.     Thyroid: No thyromegaly.  Cardiovascular:     Rate and Rhythm: Normal rate and regular rhythm.     Heart sounds: Normal heart sounds. No murmur.  Pulmonary:     Effort: Pulmonary effort is normal.     Breath sounds: Normal breath sounds.  Chest:     Breasts:        Right: No mass, nipple discharge, skin change or tenderness.        Left: No mass, nipple discharge, skin change or tenderness.  Abdominal:      Palpations: Abdomen is soft.     Tenderness: There is no abdominal tenderness. There is no guarding.  Musculoskeletal: Normal range of motion.  Neurological:     General: No focal deficit present.     Mental Status: She is alert and oriented to person, place, and time.     Cranial Nerves: No cranial nerve deficit.  Skin:    General: Skin is warm and dry.  Psychiatric:        Mood and Affect: Mood normal.        Behavior:  Behavior normal.        Thought Content: Thought content normal.        Judgment: Judgment normal.  Vitals signs reviewed.     Assessment/Plan:  Encounter for annual routine gynecological examination  Encounter for screening mammogram for malignant neoplasm of breast - Plan: MM 3D SCREEN BREAST BILATERAL; pt to sched mammo  Perimenopause--f/u prn DUB        GYN counsel breast self exam, mammography screening, menopause, adequate intake of calcium and vitamin D, diet and exercise    F/U  Return in about 1 year (around 06/20/2020).  Burnell Matlin B. Enio Hornback, PA-C 06/21/2019 9:23 AM

## 2019-06-21 ENCOUNTER — Ambulatory Visit (INDEPENDENT_AMBULATORY_CARE_PROVIDER_SITE_OTHER): Payer: BC Managed Care – PPO | Admitting: Obstetrics and Gynecology

## 2019-06-21 ENCOUNTER — Encounter: Payer: Self-pay | Admitting: Obstetrics and Gynecology

## 2019-06-21 ENCOUNTER — Other Ambulatory Visit: Payer: Self-pay

## 2019-06-21 VITALS — BP 120/78 | Ht 67.0 in | Wt 134.0 lb

## 2019-06-21 DIAGNOSIS — N951 Menopausal and female climacteric states: Secondary | ICD-10-CM

## 2019-06-21 DIAGNOSIS — Z01419 Encounter for gynecological examination (general) (routine) without abnormal findings: Secondary | ICD-10-CM

## 2019-06-21 DIAGNOSIS — Z1231 Encounter for screening mammogram for malignant neoplasm of breast: Secondary | ICD-10-CM

## 2019-11-26 ENCOUNTER — Ambulatory Visit: Payer: BC Managed Care – PPO | Attending: Internal Medicine

## 2019-11-26 DIAGNOSIS — Z23 Encounter for immunization: Secondary | ICD-10-CM

## 2019-11-26 NOTE — Progress Notes (Signed)
   Covid-19 Vaccination Clinic  Name:  Brittney Bailey    MRN: [id number] DOB: 1966/03/19  11/26/2019  Ms. [name] was observed post Covid-19 immunization for 15 minutes without incident. She was provided with Vaccine Information Sheet and instruction to access the V-Safe system.   Ms. [name] was instructed to call 911 with any severe reactions post vaccine: Marland Kitchen Difficulty breathing  . Swelling of face and throat  . A fast heartbeat  . A bad rash all over body  . Dizziness and weakness   Immunizations Administered    Name Date Dose VIS Date Route   Pfizer COVID-19 Vaccine 11/26/2019  9:46 AM 0.3 mL 08/03/2019 Intramuscular   Manufacturer: [employer], [employer]   Lot: [id number]   NDC: [address]-7

## 2019-12-19 ENCOUNTER — Ambulatory Visit: Payer: BC Managed Care – PPO | Attending: Internal Medicine

## 2019-12-19 DIAGNOSIS — Z23 Encounter for immunization: Secondary | ICD-10-CM

## 2019-12-19 NOTE — Progress Notes (Signed)
   Covid-19 Vaccination Clinic  Name:  Brittney Bailey    MRN: [id number] DOB: 05/06/1966  12/19/2019  [name] was observed post Covid-19 immunization for 15 minutes without incident. She was provided with Vaccine Information Sheet and instruction to access the V-Safe system.   [name] was instructed to call 911 with any severe reactions post vaccine: Marland Kitchen Difficulty breathing  . Swelling of face and throat  . A fast heartbeat  . A bad rash all over body  . Dizziness and weakness   Immunizations Administered    Name Date Dose VIS Date Route   Pfizer COVID-19 Vaccine 12/19/2019  8:51 AM 0.3 mL 10/17/2018 Intramuscular   Manufacturer: [employer], [employer]   Lot: [id number]   NDC: [address]-1

## 2019-12-30 IMAGING — MR MR ABDOMEN WO/W CM
12 of 17 series · 26 of 48 positions shown · IV contrast (12 ml multihance)
Comparison: Chest CT 08/09/2018

CLINICAL DATA: Right hepatic lobe lesion identified on recent chest
CT.

EXAM:
MRI ABDOMEN WITHOUT AND WITH CONTRAST
TECHNIQUE: Multiplanar multisequence MR imaging of the abdomen was performed
both before and after the administration of intravenous contrast.
CONTRAST:  12mL MULTIHANCE GADOBENATE DIMEGLUMINE 529 MG/ML IV SOLN

[Series 3: cor haste · coronal · 5.0mm · 0.70mm/px · 1 of 32 slices shown]
[im 1/32]
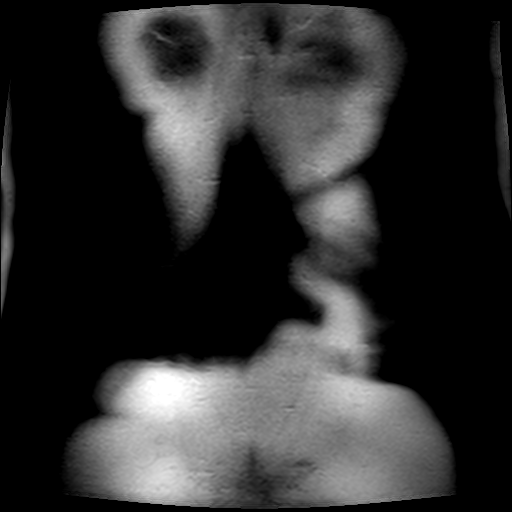

[Series 4: T1 · axial · 6.0mm · 0.74mm/px · z∈[-140,+78]mm · 2 of 68 slices shown]
[im 1/68]
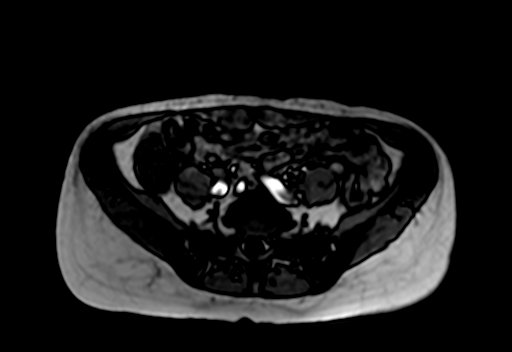
[im 68/68]
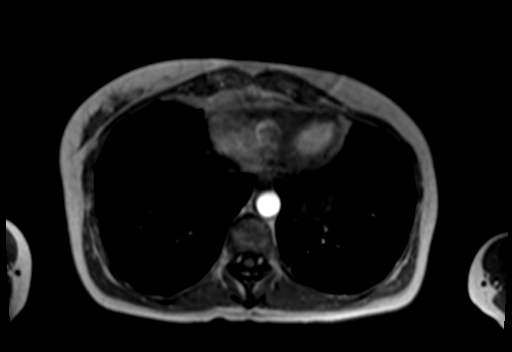

[Series 5: axial haste · axial · 6.0mm · 0.74mm/px · 1 of 36 slices shown]
[im 1/36]
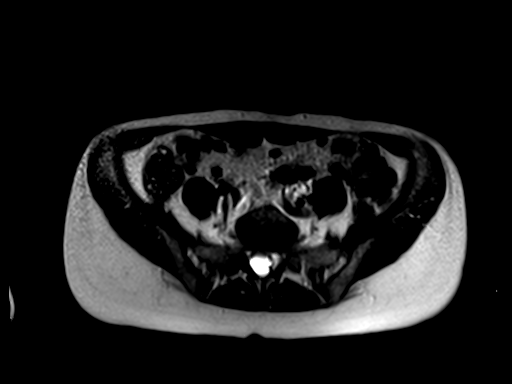

[Series 6: T2 · axial · 6.0mm · 1.06mm/px · 1 of 34 slices shown]
[im 1/34]
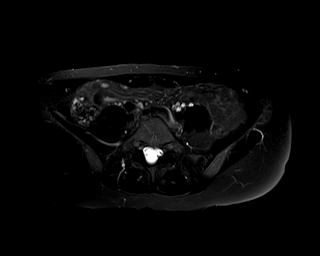

[Series 7: [id number]_trig · axial · 6.0mm · 1.77mm/px · z∈[-132,+106]mm · 3 of 102 slices shown]
[im 1/102]
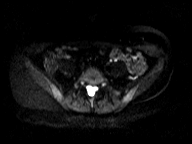
[im 51/102]
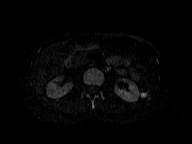
[im 102/102]
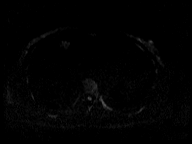

[Series 8: [id number]_trig_adc · axial · 6.0mm · 1.77mm/px · 1 of 34 slices shown]
[im 1/34]
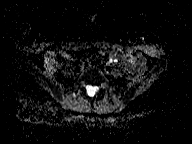

[Series 9: bSSFP · axial · 4.5mm · 0.74mm/px · 1 of 50 slices shown]
[im 1/50]
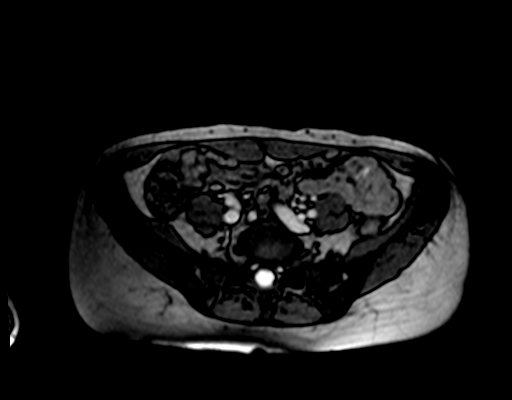

[Series 10: T1 dynamic · axial · non-contrast · 2.5mm · 0.68mm/px · z∈[-149,+88]mm · 3 of 96 slices shown]
[im 1/96]
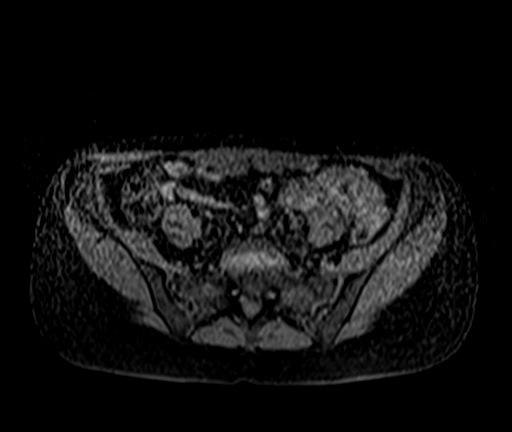
[im 48/96]
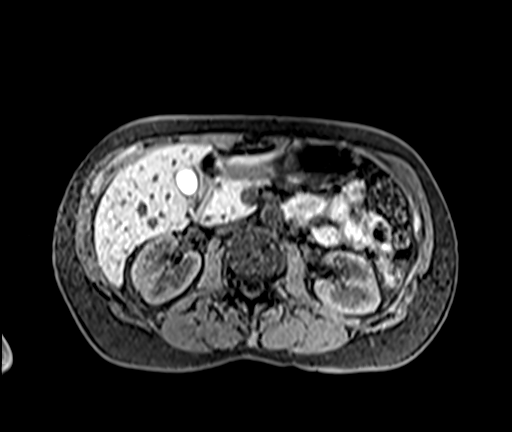
[im 96/96]
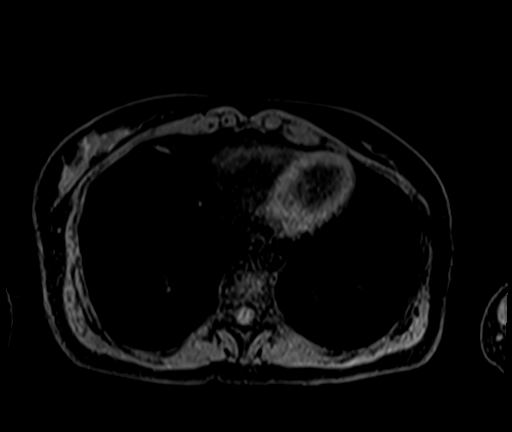

[Series 11: T1 dynamic post-contrast · axial · 2.5mm · 0.68mm/px · z∈[-149,+88]mm · 4 of 96 slices shown (1 of 4)]
[im 1/96]
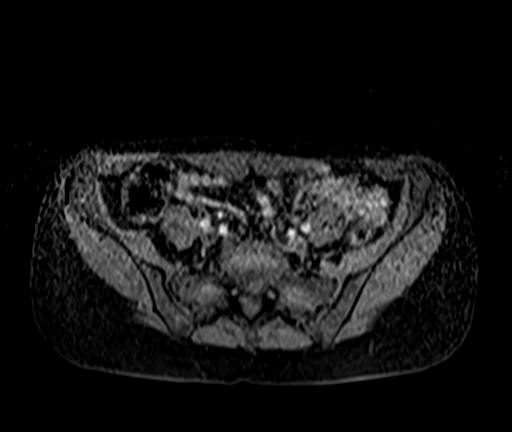
[im 32/96]
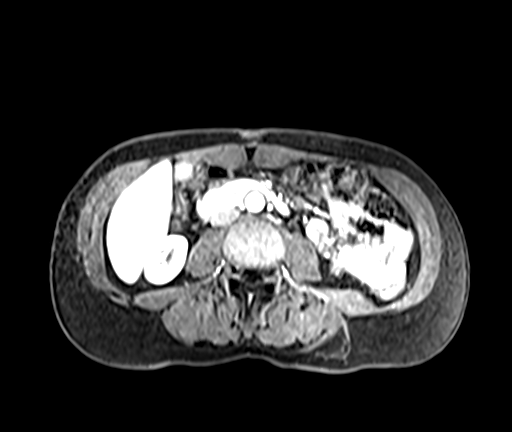
[im 64/96]
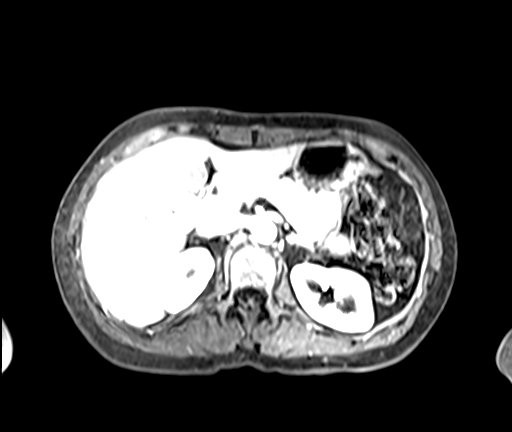
[im 96/96]
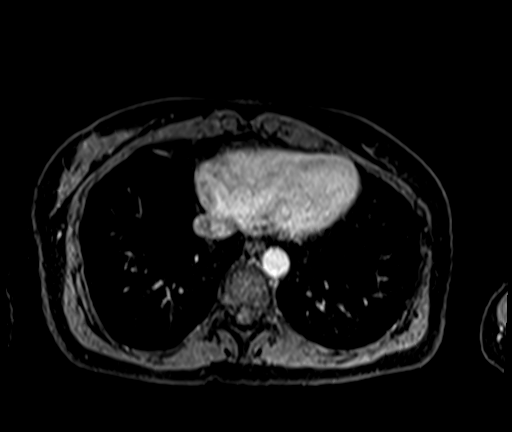

[Series 12: T1 dynamic post-contrast · axial · 2.5mm · 0.68mm/px · z∈[-149,+88]mm · 4 of 96 slices shown (2 of 4)]
[im 1/96]
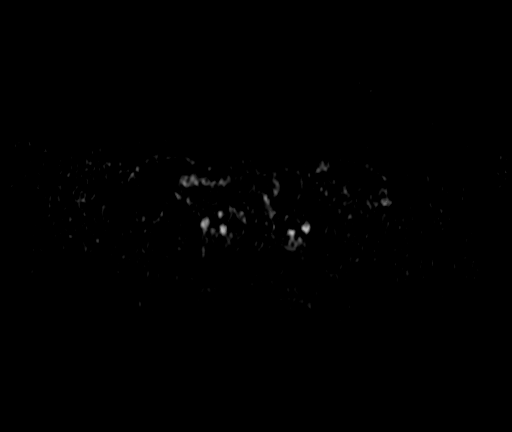
[im 32/96]
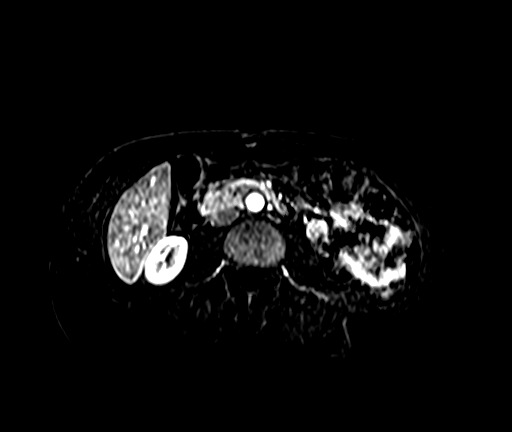
[im 64/96]
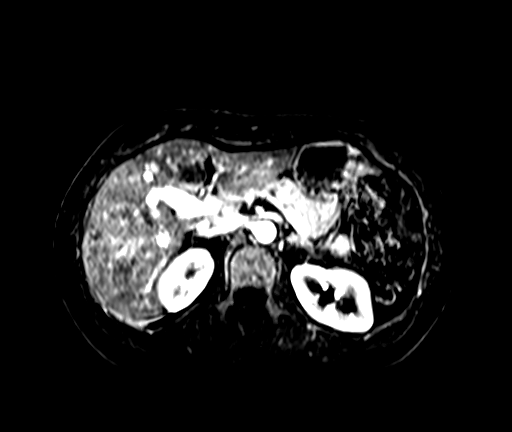
[im 96/96]
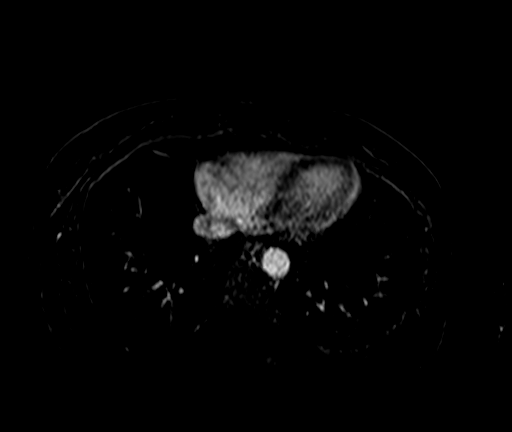

[Series 13: T1 dynamic post-contrast · axial · 2.5mm · 0.68mm/px · z∈[-149,+88]mm · 4 of 96 slices shown (3 of 4)]
[im 1/96]
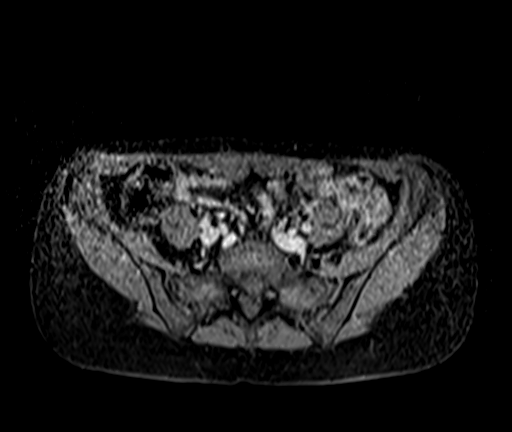
[im 32/96]
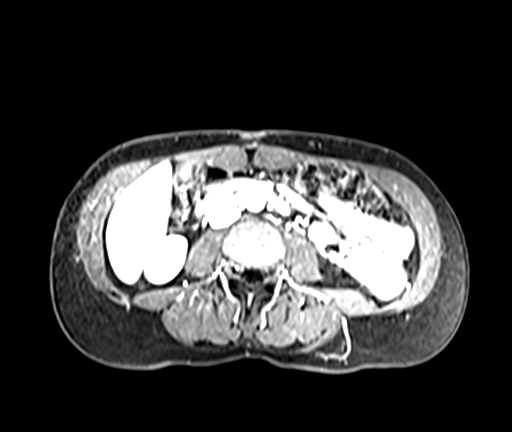
[im 64/96]
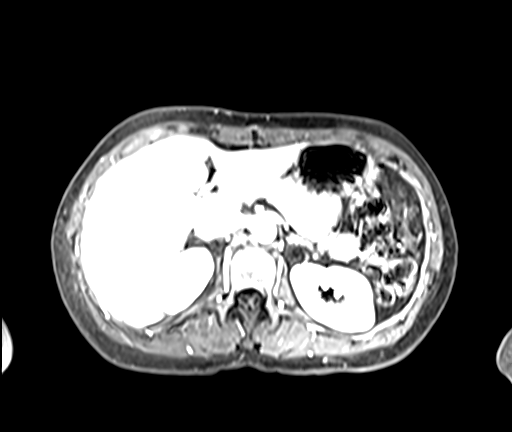
[im 96/96]
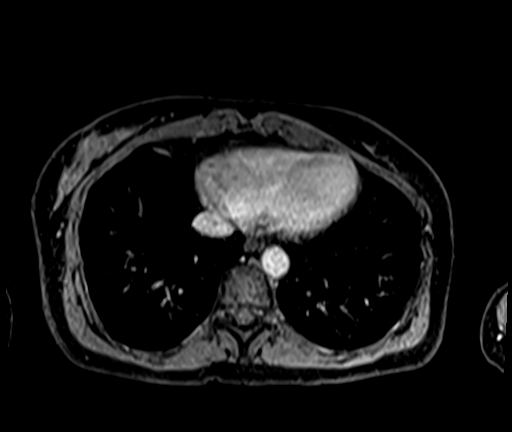

[Series 14: T1 dynamic post-contrast · axial · 2.5mm · 0.68mm/px · 1 of 96 slices shown (4 of 4)]
[im 1/96]
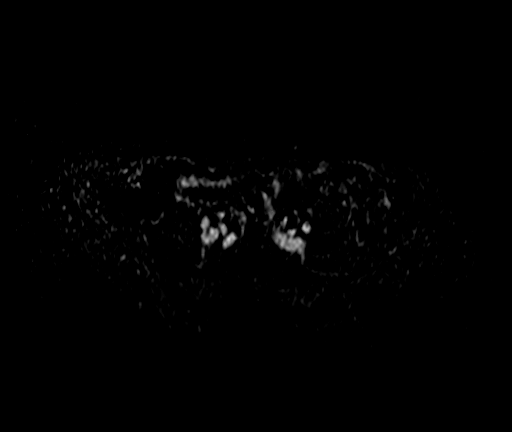

[26 of 48 positions shown; findings below may reference images not displayed]

FINDINGS: Lower chest: Unremarkable.

Hepatobiliary: Right hepatic lesion seen on previous CT is
identified on MRI today. By MRI this lesion measures 1.6 cm, is T1
hypointense and moderately hyperintense on T2 imaging. After IV
contrast administration, peripheral nodular enhancement pattern in
this lesion is characteristic for benign hemangioma. Liver
parenchyma otherwise unremarkable. No evidence of gallstones. No
intrahepatic or extrahepatic biliary dilation.

Pancreas: No focal mass lesion. No dilatation of the main duct. No
intraparenchymal cyst. No peripancreatic edema.

Spleen:  No splenomegaly. No focal mass lesion.

Adrenals/Urinary Tract: No adrenal nodule or mass. 7 mm simple cyst
identified interpolar left kidney.

Stomach/Bowel: Stomach is nondistended. No gastric wall thickening.
No evidence of outlet obstruction. Duodenum is normally positioned
as is the ligament of Treitz. No small bowel or colonic dilatation
within the visualized abdomen.

Vascular/Lymphatic: No abdominal aortic aneurysm. No abdominal
lymphadenopathy.

Other:  No intraperitoneal free fluid.

Musculoskeletal: No abnormal marrow enhancement within the
visualized bony anatomy.
IMPRESSION: 1. Right hepatic lobe lesion of concern on previous chest CT
represents benign cavernous hemangioma.
2. Tiny simple cyst interpolar left kidney.

## 2020-08-28 NOTE — Progress Notes (Addendum)
PCP: Medicine, [facility]   Chief Complaint  Patient presents with   Gynecologic Exam    Annual - found lump in Lt breast. RM 14    HPI:      [name] is a 55 y.o. No obstetric history on file. who LMP was No LMP recorded., presents today for her annual examination.  Her menses are absent for over a year. No PMB. She has occas vasomotor sx.   Sex activity: single partner. She has noticed decreased lubrication, ok with lubricants.   Last Pap: 06/09/18  Results were: no abnormalities /neg HPV DNA.  Hx of STDs: none  Last mammogram: 07/05/18  Results were: normal--routine follow-up in 12 months There is a FH of breast cancer in her PGGM, genetic testing not indicated. There is no FH of ovarian cancer. The patient does do self-breast exams. Noticed a LT breast mobile mass a few days ago with putting on deodorant. No pain/tenderness/erythema/trauma/nipple d/c.   Colonoscopy: 8/19 without abnormalities;  Repeat due after 10 years.   Tobacco use: The patient denies current or previous tobacco use. Alcohol use: none No drug use Exercise: moderately active  She does get adequate calcium and Vitamin D in her diet.  Labs with PCP. Diagnosed with bronchiectasis due to MAC. On abx inhaler daily. Has regular labs for liver.   Past Medical History:  Diagnosis Date   Bronchiectasis ([place]) 06/2018   History of mammogram 07/25/2013   BIRAD 2; 08/01/15 neg   Migraine 08/2016   with aura;now also optic migraines    Past Surgical History:  Procedure Laterality Date   CESAREAN SECTION  1993   FLEXIBLE BRONCHOSCOPY Bilateral 08/25/2018   Procedure: FLEXIBLE BRONCHOSCOPY WITH BAL;  Surgeon: Ottie Glazier, MD;  Location: [facility] ORS;  Service: Thoracic;  Laterality: Bilateral;   Tummy tuck      Family History  Problem Relation Age of Onset   Heart disease Father    Lung cancer Paternal Grandfather     Social History   Socioeconomic History   Marital status:  Married    Spouse name: Not on file   Number of children: Not on file   Years of education: Not on file   Highest education level: Not on file  Occupational History   Not on file  Tobacco Use   Smoking status: Never Smoker   Smokeless tobacco: Never Used  Vaping Use   Vaping Use: Never used  Substance and Sexual Activity   Alcohol use: Yes    Comment: rarely   Drug use: Never   Sexual activity: Yes    Birth control/protection: Condom  Other Topics Concern   Not on file  Social History Narrative   Not on file   Social Determinants of Health   Financial Resource Strain: Not on file  Food Insecurity: Not on file  Transportation Needs: Not on file  Physical Activity: Not on file  Stress: Not on file  Social Connections: Not on file  Intimate Partner Violence: Not on file    Outpatient Medications Prior to Visit  Medication Sig Dispense Refill   ARIKAYCE 590 MG/8.4ML SUSP Inhale into the lungs.     Cholecalciferol (VITAMIN D3 GUMMIES ADULT PO) Take 2 tablets by mouth daily.     Cyanocobalamin (CVS B12 GUMMIES PO) Take 2 tablets by mouth daily.     Sodium Chloride, Inhalant, 7 % NEBU SMARTSIG:1 Vial(s) Via Inhaler 4 Times Daily     albuterol (ACCUNEB) 1.25 MG/3ML nebulizer solution Inhale  into the lungs.     No facility-administered medications prior to visit.    ROS:  Review of Systems  Constitutional: Negative for fatigue, fever and unexpected weight change.  Respiratory: Negative for cough, shortness of breath and wheezing.   Cardiovascular: Negative for chest pain, palpitations and leg swelling.  Gastrointestinal: Negative for blood in stool, constipation, diarrhea, nausea and vomiting.  Endocrine: Negative for cold intolerance, heat intolerance and polyuria.  Genitourinary: Negative for dyspareunia, dysuria, flank pain, frequency, genital sores, hematuria, menstrual problem, pelvic pain, urgency, vaginal bleeding, vaginal discharge and vaginal  pain.  Musculoskeletal: Negative for back pain, joint swelling and myalgias.  Skin: Negative for rash.  Neurological: Negative for dizziness, syncope, light-headedness, numbness and headaches.  Hematological: Negative for adenopathy.  Psychiatric/Behavioral: Negative for agitation, confusion, sleep disturbance and suicidal ideas. The patient is not nervous/anxious.    BREAST: mass    Objective: BP 110/66    Ht _0  (1.702 m)    Wt 116 lb 4 oz (52.7 kg)    BMI 18.21 kg/m    Physical Exam Constitutional:      Appearance: She is well-developed.  Genitourinary:     Vulva normal.     Right Labia: No rash, tenderness or lesions.    Left Labia: No tenderness, lesions or rash.    No vaginal discharge, erythema or tenderness.      Right Adnexa: not tender and no mass present.    Left Adnexa: not tender and no mass present.    No cervical motion tenderness, friability or polyp.     Uterus is not enlarged or tender.  Breasts:     Right: No mass, nipple discharge, skin change or tenderness.     Left: Mass present. No nipple discharge, skin change or tenderness.    Neck:     Thyroid: No thyromegaly.  Cardiovascular:     Rate and Rhythm: Normal rate and regular rhythm.     Heart sounds: Normal heart sounds. No murmur heard.   Pulmonary:     Effort: Pulmonary effort is normal.     Breath sounds: Examination of the right-upper field reveals wheezing. Examination of the left-upper field reveals wheezing. Examination of the right-middle field reveals wheezing. Examination of the left-middle field reveals wheezing. Examination of the right-lower field reveals wheezing. Examination of the left-lower field reveals wheezing. Wheezing present.  Chest:    Abdominal:     Palpations: Abdomen is soft.     Tenderness: There is no abdominal tenderness. There is no guarding or rebound.  Musculoskeletal:        General: Normal range of motion.     Cervical back: Normal range of motion.   Lymphadenopathy:     Cervical: No cervical adenopathy.  Neurological:     General: No focal deficit present.     Mental Status: She is alert and oriented to person, place, and time.     Cranial Nerves: No cranial nerve deficit.  Skin:    General: Skin is warm and dry.  Psychiatric:        Mood and Affect: Mood normal.        Behavior: Behavior normal.        Thought Content: Thought content normal.        Judgment: Judgment normal.  Vitals reviewed.     Assessment/Plan:  Encounter for annual routine gynecological examination  Encounter for screening mammogram for malignant neoplasm of breast - Plan: MM DIAG BREAST TOMO BILATERAL, US BREAST LTD UNI  LEFT INC AXILLA, US BREAST LTD UNI RIGHT INC AXILLA  Left breast mass - Plan: MM DIAG BREAST TOMO BILATERAL, US BREAST LTD UNI LEFT INC AXILLA; Lt breast 2:00 pos. Check dx mammo and u/s.  Will f/u with results.           GYN counsel breast self exam, mammography screening, menopause, adequate intake of calcium and vitamin D, diet and exercise    F/U  Return in about 1 year (around 08/29/2021).  Zawadi Aplin B. Josslynn Mentzer, PA-C 08/29/2020 11:53 AM

## 2020-08-29 ENCOUNTER — Encounter: Payer: Self-pay | Admitting: Obstetrics and Gynecology

## 2020-08-29 ENCOUNTER — Ambulatory Visit (INDEPENDENT_AMBULATORY_CARE_PROVIDER_SITE_OTHER): Payer: BC Managed Care – PPO | Admitting: Obstetrics and Gynecology

## 2020-08-29 ENCOUNTER — Other Ambulatory Visit: Payer: Self-pay

## 2020-08-29 VITALS — BP 110/66 | Ht 67.0 in | Wt 116.2 lb

## 2020-08-29 DIAGNOSIS — Z1231 Encounter for screening mammogram for malignant neoplasm of breast: Secondary | ICD-10-CM

## 2020-08-29 DIAGNOSIS — Z23 Encounter for immunization: Secondary | ICD-10-CM | POA: Diagnosis not present

## 2020-08-29 DIAGNOSIS — N6320 Unspecified lump in the left breast, unspecified quadrant: Secondary | ICD-10-CM | POA: Diagnosis not present

## 2020-08-29 DIAGNOSIS — Z01419 Encounter for gynecological examination (general) (routine) without abnormal findings: Secondary | ICD-10-CM | POA: Diagnosis not present

## 2020-08-29 NOTE — Patient Instructions (Signed)
I value your feedback and you entrusting us with your care. If you get a Brittney Bailey patient survey, I would appreciate you taking the time to let us know about your experience today. Thank you! ? ? ?

## 2020-09-01 ENCOUNTER — Telehealth: Payer: Self-pay | Admitting: Obstetrics and Gynecology

## 2020-09-01 NOTE — Telephone Encounter (Signed)
Left message for patient to call office back in regards to her appt time and date at [facility] on 09/12/20 @ 10:00am

## 2020-09-12 ENCOUNTER — Ambulatory Visit
Admission: RE | Admit: 2020-09-12 | Discharge: 2020-09-12 | Disposition: A | Discharge status: Home / Self Care | Payer: BC Managed Care – PPO | Source: Ambulatory Visit | Attending: Obstetrics and Gynecology | Admitting: Obstetrics and Gynecology

## 2020-09-12 ENCOUNTER — Other Ambulatory Visit: Payer: Self-pay

## 2020-09-12 DIAGNOSIS — Z1231 Encounter for screening mammogram for malignant neoplasm of breast: Secondary | ICD-10-CM

## 2020-09-12 DIAGNOSIS — N6320 Unspecified lump in the left breast, unspecified quadrant: Secondary | ICD-10-CM

## 2021-06-09 ENCOUNTER — Other Ambulatory Visit: Payer: Self-pay | Admitting: Infectious Diseases

## 2021-06-09 DIAGNOSIS — Z792 Long term (current) use of antibiotics: Secondary | ICD-10-CM

## 2021-06-09 DIAGNOSIS — R7989 Other specified abnormal findings of blood chemistry: Secondary | ICD-10-CM

## 2021-06-09 DIAGNOSIS — A310 Pulmonary mycobacterial infection: Secondary | ICD-10-CM

## 2021-08-27 ENCOUNTER — Other Ambulatory Visit: Payer: Self-pay

## 2021-08-27 ENCOUNTER — Ambulatory Visit
Admission: RE | Admit: 2021-08-27 | Discharge: 2021-08-27 | Disposition: A | Discharge status: Home / Self Care | Payer: BC Managed Care – PPO | Source: Ambulatory Visit | Attending: Infectious Diseases | Admitting: Infectious Diseases

## 2021-08-27 DIAGNOSIS — Z792 Long term (current) use of antibiotics: Secondary | ICD-10-CM | POA: Diagnosis present

## 2021-08-27 DIAGNOSIS — R7989 Other specified abnormal findings of blood chemistry: Secondary | ICD-10-CM | POA: Insufficient documentation

## 2021-08-27 DIAGNOSIS — A310 Pulmonary mycobacterial infection: Secondary | ICD-10-CM | POA: Diagnosis not present

## 2021-08-31 NOTE — Progress Notes (Signed)
PCP: Leonel Ramsay, MD   Chief Complaint  Patient presents with   Gynecologic Exam    No concerns    HPI:      Brittney Bailey is a 56 y.o. No obstetric history on file. who LMP was Patient's last menstrual period was 08/03/2020., presents today for her annual examination.  Her menses are absent due to menopause. No PMB. She has occas vasomotor sx.   Sex activity: single partner. She has noticed decreased lubrication, ok with lubricants.   Last Pap: 06/09/18  Results were: no abnormalities /neg HPV DNA.  Hx of STDs: none  Last mammogram: 09/12/20  Results were: normal, with stable lymph node LT breast--routine follow-up in 12 months There is a FH of breast cancer in her PGGM, genetic testing not indicated. There is no FH of ovarian cancer. The patient does self-breast exams.   Colonoscopy: 8/19 without abnormalities;  Repeat due after 10 years.   Tobacco use: The patient denies current or previous tobacco use. Alcohol use: none No drug use Exercise: moderately active  She does get adequate calcium and Vitamin D in her diet.  Labs with PCP. Diagnosed with bronchiectasis due to MAC. Was able to stop tx a few wks ago and has f/u with MD in a couple wks. Doing well with sx. Has regular labs for liver.   Past Medical History:  Diagnosis Date   Bronchiectasis ([place]) 06/2018   History of mammogram 07/25/2013   BIRAD 2; 08/01/15 neg   Migraine 08/2016   with aura;now also optic migraines    Past Surgical History:  Procedure Laterality Date   CESAREAN SECTION  1993   FLEXIBLE BRONCHOSCOPY Bilateral 08/25/2018   Procedure: FLEXIBLE BRONCHOSCOPY WITH BAL;  Surgeon: Ottie Glazier, MD;  Location: [facility] ORS;  Service: Thoracic;  Laterality: Bilateral;   Tummy tuck      Family History  Problem Relation Age of Onset   Heart disease Father    Lung cancer Paternal Grandfather    Breast cancer Paternal Great-grandmother        92s    Social History   Socioeconomic  History   Marital status: Married    Spouse name: Not on file   Number of children: Not on file   Years of education: Not on file   Highest education level: Not on file  Occupational History   Not on file  Tobacco Use   Smoking status: Never   Smokeless tobacco: Never  Vaping Use   Vaping Use: Never used  Substance and Sexual Activity   Alcohol use: Yes    Comment: rarely   Drug use: Never   Sexual activity: Yes    Birth control/protection: Condom  Other Topics Concern   Not on file  Social History Narrative   Not on file   Social Determinants of Health   Financial Resource Strain: Not on file  Food Insecurity: Not on file  Transportation Needs: Not on file  Physical Activity: Not on file  Stress: Not on file  Social Connections: Not on file  Intimate Partner Violence: Not on file    Outpatient Medications Prior to Visit  Medication Sig Dispense Refill   Cholecalciferol (VITAMIN D3 GUMMIES ADULT PO) Take 2 tablets by mouth daily.     Cyanocobalamin ([employer] B12 GUMMIES PO) Take 2 tablets by mouth daily.     albuterol (ACCUNEB) 1.25 MG/3ML nebulizer solution Inhale into the lungs.     ARIKAYCE 590 MG/8.4ML SUSP Inhale into the lungs.  Sodium Chloride, Inhalant, 7 % NEBU SMARTSIG:1 Vial(s) Via Inhaler 4 Times Daily     No facility-administered medications prior to visit.    ROS:  Review of Systems  Constitutional:  Negative for fatigue, fever and unexpected weight change.  Respiratory:  Negative for cough, shortness of breath and wheezing.   Cardiovascular:  Negative for chest pain, palpitations and leg swelling.  Gastrointestinal:  Negative for blood in stool, constipation, diarrhea, nausea and vomiting.  Endocrine: Negative for cold intolerance, heat intolerance and polyuria.  Genitourinary:  Negative for dyspareunia, dysuria, flank pain, frequency, genital sores, hematuria, menstrual problem, pelvic pain, urgency, vaginal bleeding, vaginal discharge and vaginal  pain.  Musculoskeletal:  Negative for back pain, joint swelling and myalgias.  Skin:  Negative for rash.  Neurological:  Negative for dizziness, syncope, light-headedness, numbness and headaches.  Hematological:  Negative for adenopathy.  Psychiatric/Behavioral:  Negative for agitation, confusion, sleep disturbance and suicidal ideas. The patient is not nervous/anxious.   BREAST: none    Objective: BP 110/80    Ht _0  (1.702 m)    Wt 111 lb (50.3 kg)    LMP 08/03/2020    BMI 17.39 kg/m    Physical Exam Constitutional:      Appearance: She is well-developed.  Genitourinary:     Vulva normal.     Right Labia: No rash, tenderness or lesions.    Left Labia: No tenderness, lesions or rash.    No vaginal discharge, erythema or tenderness.      Right Adnexa: not tender and no mass present.    Left Adnexa: not tender and no mass present.    No cervical friability or polyp.     Uterus is not enlarged or tender.  Breasts:    Right: No mass, nipple discharge, skin change or tenderness.     Left: No mass, nipple discharge, skin change or tenderness.  Neck:     Thyroid: No thyromegaly.  Cardiovascular:     Rate and Rhythm: Normal rate and regular rhythm.     Heart sounds: Normal heart sounds. No murmur heard. Pulmonary:     Effort: Pulmonary effort is normal.     Breath sounds: Wheezing present.  Abdominal:     Palpations: Abdomen is soft.     Tenderness: There is no abdominal tenderness. There is no guarding or rebound.  Musculoskeletal:        General: Normal range of motion.     Cervical back: Normal range of motion.  Lymphadenopathy:     Cervical: No cervical adenopathy.  Neurological:     General: No focal deficit present.     Mental Status: She is alert and oriented to person, place, and time.     Cranial Nerves: No cranial nerve deficit.  Skin:    General: Skin is warm and dry.  Psychiatric:        Mood and Affect: Mood normal.        Behavior: Behavior normal.         Thought Content: Thought content normal.        Judgment: Judgment normal.  Vitals reviewed.    Assessment/Plan:  Encounter for annual routine gynecological examination  Encounter for screening mammogram for malignant neoplasm of breast - Plan: MM 3D SCREEN BREAST BILATERAL; pt to [facility]        GYN counsel breast self exam, mammography screening, adequate intake of calcium and vitamin D, diet and exercise    F/U  Return in about 1  year (around 09/01/2022).  Kais Monje B. Adriana Lina, PA-C 09/01/2021 8:32 AM

## 2021-09-01 ENCOUNTER — Encounter: Payer: Self-pay | Admitting: Obstetrics and Gynecology

## 2021-09-01 ENCOUNTER — Ambulatory Visit (INDEPENDENT_AMBULATORY_CARE_PROVIDER_SITE_OTHER): Payer: BC Managed Care – PPO | Admitting: Obstetrics and Gynecology

## 2021-09-01 ENCOUNTER — Other Ambulatory Visit: Payer: Self-pay

## 2021-09-01 VITALS — BP 110/80 | Ht 67.0 in | Wt 111.0 lb

## 2021-09-01 DIAGNOSIS — Z01419 Encounter for gynecological examination (general) (routine) without abnormal findings: Secondary | ICD-10-CM

## 2021-09-01 DIAGNOSIS — Z1231 Encounter for screening mammogram for malignant neoplasm of breast: Secondary | ICD-10-CM

## 2021-09-01 NOTE — Patient Instructions (Addendum)
I value your feedback and you entrusting us with your care. If you get a [facility] patient survey, I would appreciate you taking the time to let us know about your experience today. Thank you!  [facility]: [phone]      

## 2021-09-29 ENCOUNTER — Ambulatory Visit
Admission: RE | Admit: 2021-09-29 | Discharge: 2021-09-29 | Disposition: A | Discharge status: Home / Self Care | Payer: BC Managed Care – PPO | Source: Ambulatory Visit | Attending: Obstetrics and Gynecology | Admitting: Obstetrics and Gynecology

## 2021-09-29 ENCOUNTER — Other Ambulatory Visit: Payer: Self-pay

## 2021-09-29 DIAGNOSIS — Z1231 Encounter for screening mammogram for malignant neoplasm of breast: Secondary | ICD-10-CM | POA: Insufficient documentation

## 2022-07-13 ENCOUNTER — Other Ambulatory Visit: Payer: Self-pay

## 2022-07-13 DIAGNOSIS — A310 Pulmonary mycobacterial infection: Secondary | ICD-10-CM

## 2022-07-13 DIAGNOSIS — R058 Other specified cough: Secondary | ICD-10-CM

## 2022-07-21 ENCOUNTER — Other Ambulatory Visit: Payer: Self-pay | Admitting: Infectious Diseases

## 2022-07-21 DIAGNOSIS — R058 Other specified cough: Secondary | ICD-10-CM

## 2022-07-21 DIAGNOSIS — A310 Pulmonary mycobacterial infection: Secondary | ICD-10-CM

## 2022-08-25 ENCOUNTER — Ambulatory Visit
Admission: RE | Admit: 2022-08-25 | Discharge: 2022-08-25 | Disposition: A | Discharge status: Home / Self Care | Payer: BC Managed Care – PPO | Source: Ambulatory Visit | Attending: Infectious Diseases | Admitting: Infectious Diseases

## 2022-08-25 DIAGNOSIS — R058 Other specified cough: Secondary | ICD-10-CM

## 2022-08-25 DIAGNOSIS — A310 Pulmonary mycobacterial infection: Secondary | ICD-10-CM

## 2022-12-16 ENCOUNTER — Other Ambulatory Visit: Payer: BC Managed Care – PPO

## 2022-12-17 ENCOUNTER — Other Ambulatory Visit: Payer: Self-pay | Admitting: Infectious Diseases

## 2022-12-17 DIAGNOSIS — Z1231 Encounter for screening mammogram for malignant neoplasm of breast: Secondary | ICD-10-CM

## 2022-12-22 ENCOUNTER — Other Ambulatory Visit: Payer: BC Managed Care – PPO

## 2022-12-24 ENCOUNTER — Ambulatory Visit: Admit: 2022-12-24 | Payer: BC Managed Care – PPO

## 2022-12-24 SURGERY — BRONCHOSCOPY, FLEXIBLE
Anesthesia: General

## 2023-01-13 ENCOUNTER — Ambulatory Visit
Admission: RE | Admit: 2023-01-13 | Discharge: 2023-01-13 | Disposition: A | Discharge status: Home / Self Care | Payer: BC Managed Care – PPO | Source: Ambulatory Visit | Attending: Infectious Diseases | Admitting: Infectious Diseases

## 2023-01-13 DIAGNOSIS — Z1231 Encounter for screening mammogram for malignant neoplasm of breast: Secondary | ICD-10-CM | POA: Diagnosis present

## 2023-07-18 ENCOUNTER — Other Ambulatory Visit: Payer: Self-pay | Admitting: Pulmonary Disease

## 2023-07-20 ENCOUNTER — Other Ambulatory Visit: Payer: Self-pay

## 2023-07-20 ENCOUNTER — Encounter
Admission: RE | Admit: 2023-07-20 | Discharge: 2023-07-20 | Disposition: A | Discharge status: Home / Self Care | Payer: Self-pay | Source: Ambulatory Visit | Attending: Pulmonary Disease | Admitting: Pulmonary Disease

## 2023-07-20 DIAGNOSIS — J984 Other disorders of lung: Secondary | ICD-10-CM

## 2023-07-20 DIAGNOSIS — R0602 Shortness of breath: Secondary | ICD-10-CM

## 2023-07-20 HISTORY — DX: Other pulmonary aspergillosis: B44.1

## 2023-07-20 HISTORY — DX: Dyspnea, unspecified: R06.00

## 2023-07-20 HISTORY — DX: Other mycobacterial infections: A31.8

## 2023-07-20 HISTORY — DX: Pneumonia, unspecified organism: J18.9

## 2023-07-20 HISTORY — DX: Family history of other specified conditions: Z84.89

## 2023-07-20 NOTE — Patient Instructions (Addendum)
Your procedure is scheduled on: 07/29/23 - Friday Report to the Registration Desk on the 1st floor of the [facility]. If your arrival time is 6:00 am, do not arrive before that time as the [facility] entrance doors do not open until 6:00 am.  REMEMBER: Instructions that are not followed completely may result in serious medical risk, up to and including death; or upon the discretion of your surgeon and anesthesiologist your surgery may need to be rescheduled.  Do not eat food or drink any liquids after midnight the night before surgery.  No gum chewing or hard candies.   One week prior to surgery: Stop on 07/23/23, Anti-inflammatories (NSAIDS) such as Advil, Aleve, Ibuprofen, Motrin, Naproxen, Naprosyn and Aspirin based products such as Excedrin, Goody's Powder, BC Powder. You may take Tylenol if needed for pain up until the day of surgery.  Stop on 07/23/23, ANY OVER THE COUNTER supplements until after surgery : VITAMIN D3 , B12 GUMMIES .   ON THE DAY OF SURGERY ONLY TAKE THESE MEDICATIONS WITH SIPS OF WATER:   Use inhalers on the day of surgery    No Alcohol for 24 hours before or after surgery.  No Smoking including e-cigarettes for 24 hours before surgery.  No chewable tobacco products for at least 6 hours before surgery.  No nicotine patches on the day of surgery.  Do not use any "recreational" drugs for at least a week (preferably 2 weeks) before your surgery.  Please be advised that the combination of cocaine and anesthesia may have negative outcomes, up to and including death. If you test positive for cocaine, your surgery will be cancelled.  On the morning of surgery brush your teeth with toothpaste and water, you may rinse your mouth with mouthwash if you wish. Do not swallow any toothpaste or mouthwash.   Do not wear jewelry, make-up, hairpins, clips or nail polish.  For welded (permanent) jewelry: bracelets, anklets, waist bands, etc.  Please have this removed  prior to surgery.  If it is not removed, there is a chance that hospital personnel will need to cut it off on the day of surgery.  Do not wear lotions, powders, or perfumes.   Do not shave body hair from the neck down 48 hours before surgery.  Contact lenses, hearing aids and dentures may not be worn into surgery.  Do not bring valuables to the hospital. [facility] is not responsible for any missing/lost belongings or valuables.   Notify your doctor if there is any change in your medical condition (cold, fever, infection).  Wear comfortable clothing (specific to your surgery type) to the hospital.  After surgery, you can help prevent lung complications by doing breathing exercises.  Take deep breaths and cough every 1-2 hours. Your doctor may order a device called an Incentive Spirometer to help you take deep breaths. When coughing or sneezing, hold a pillow firmly against your incision with both hands. This is called "splinting." Doing this helps protect your incision. It also decreases belly discomfort.  If you are being admitted to the hospital overnight, leave your suitcase in the car. After surgery it may be brought to your room.  In case of increased patient census, it may be necessary for you, the patient, to continue your postoperative care in the Same Day Surgery department.  If you are being discharged the day of surgery, you will not be allowed to drive home. You will need a responsible individual to drive you home and stay  with you for 24 hours after surgery.   If you are taking public transportation, you will need to have a responsible individual with you.  Please call the Pre-admissions Testing Dept. at [phone] if you have any questions about these instructions.  Surgery Visitation Policy:  Patients having surgery or a procedure may have two visitors.  Children under the age of 39 must have an adult with them who is not the patient.  Inpatient Visitation:     Visiting hours are 7 a.m. to 8 p.m. Up to four visitors are allowed at one time in a patient room. The visitors may rotate out with other people during the day.  One visitor age 90 or older may stay with the patient overnight and must be in the room by 8 p.m.

## 2023-07-26 ENCOUNTER — Encounter
Admission: RE | Admit: 2023-07-26 | Discharge: 2023-07-26 | Disposition: A | Discharge status: Home / Self Care | Payer: PRIVATE HEALTH INSURANCE | Source: Ambulatory Visit | Attending: Pulmonary Disease | Admitting: Pulmonary Disease

## 2023-07-26 DIAGNOSIS — Z01818 Encounter for other preprocedural examination: Secondary | ICD-10-CM | POA: Insufficient documentation

## 2023-07-26 DIAGNOSIS — J984 Other disorders of lung: Secondary | ICD-10-CM | POA: Insufficient documentation

## 2023-07-26 DIAGNOSIS — Z01810 Encounter for preprocedural cardiovascular examination: Secondary | ICD-10-CM | POA: Diagnosis not present

## 2023-07-26 DIAGNOSIS — R0602 Shortness of breath: Secondary | ICD-10-CM | POA: Diagnosis not present

## 2023-07-26 LAB — CBC
HCT: 36.5 % (ref 36.0–46.0)
Hemoglobin: 12.5 g/dL (ref 12.0–15.0)
MCH: 31.1 pg (ref 26.0–34.0)
MCHC: 34.2 g/dL (ref 30.0–36.0)
MCV: 90.8 fL (ref 80.0–100.0)
Platelets: 249 10*3/uL (ref 150–400)
RBC: 4.02 MIL/uL (ref 3.87–5.11)
RDW: 11.7 % (ref 11.5–15.5)
WBC: 5.3 10*3/uL (ref 4.0–10.5)
nRBC: 0 % (ref 0.0–0.2)

## 2023-07-28 MED ORDER — LACTATED RINGERS IV SOLN: INTRAVENOUS | Status: DC

## 2023-07-28 MED ORDER — ORAL CARE MOUTH RINSE: 15.0000 mL | Freq: Once | OROMUCOSAL | Status: AC

## 2023-07-28 MED ORDER — CHLORHEXIDINE GLUCONATE 0.12 % MT SOLN
15.0000 mL | Freq: Once | OROMUCOSAL | Status: AC
  Administered 2023-07-29: 15 mL via OROMUCOSAL

## 2023-07-29 ENCOUNTER — Ambulatory Visit: Payer: PRIVATE HEALTH INSURANCE

## 2023-07-29 ENCOUNTER — Ambulatory Visit: Payer: PRIVATE HEALTH INSURANCE | Admitting: Certified Registered"

## 2023-07-29 ENCOUNTER — Encounter
Admission: RE | Disposition: A | Discharge status: Home / Self Care | Payer: Self-pay | Source: Ambulatory Visit | Attending: Pulmonary Disease

## 2023-07-29 ENCOUNTER — Ambulatory Visit: Payer: PRIVATE HEALTH INSURANCE | Admitting: Urgent Care

## 2023-07-29 ENCOUNTER — Other Ambulatory Visit: Payer: Self-pay

## 2023-07-29 ENCOUNTER — Ambulatory Visit
Admission: RE | Admit: 2023-07-29 | Discharge: 2023-07-29 | Disposition: A | Discharge status: Home / Self Care | Payer: PRIVATE HEALTH INSURANCE | Source: Ambulatory Visit | Attending: Pulmonary Disease | Admitting: Pulmonary Disease

## 2023-07-29 DIAGNOSIS — R519 Headache, unspecified: Secondary | ICD-10-CM | POA: Diagnosis not present

## 2023-07-29 DIAGNOSIS — J984 Other disorders of lung: Secondary | ICD-10-CM | POA: Insufficient documentation

## 2023-07-29 DIAGNOSIS — R0602 Shortness of breath: Secondary | ICD-10-CM | POA: Insufficient documentation

## 2023-07-29 DIAGNOSIS — R059 Cough, unspecified: Secondary | ICD-10-CM | POA: Insufficient documentation

## 2023-07-29 DIAGNOSIS — J479 Bronchiectasis, uncomplicated: Secondary | ICD-10-CM | POA: Diagnosis present

## 2023-07-29 HISTORY — PX: FLEXIBLE BRONCHOSCOPY: SHX5094

## 2023-07-29 HISTORY — PX: BRONCHIAL WASHINGS: SHX5105

## 2023-07-29 SURGERY — IRRIGATION, BRONCHUS
Anesthesia: General

## 2023-07-29 MED ORDER — FENTANYL CITRATE (PF) 100 MCG/2ML IJ SOLN
INTRAMUSCULAR | Status: AC
  Filled 2023-07-29: qty 2

## 2023-07-29 MED ORDER — FENTANYL CITRATE (PF) 100 MCG/2ML IJ SOLN
INTRAMUSCULAR | Status: DC | PRN
Start: 2023-07-29 — End: 2023-07-29
  Administered 2023-07-29: 25 ug via INTRAVENOUS
  Administered 2023-07-29: 75 ug via INTRAVENOUS

## 2023-07-29 MED ORDER — ONDANSETRON HCL 4 MG/2ML IJ SOLN
INTRAMUSCULAR | Status: DC | PRN
  Administered 2023-07-29 (×2): 4 mg via INTRAVENOUS

## 2023-07-29 MED ORDER — LIDOCAINE HCL (CARDIAC) PF 100 MG/5ML IV SOSY
PREFILLED_SYRINGE | INTRAVENOUS | Status: DC | PRN
  Administered 2023-07-29: 100 mg via INTRAVENOUS

## 2023-07-29 MED ORDER — CHLORHEXIDINE GLUCONATE 0.12 % MT SOLN
OROMUCOSAL | Status: AC
  Filled 2023-07-29: qty 15

## 2023-07-29 MED ORDER — GLYCOPYRROLATE 0.2 MG/ML IJ SOLN
INTRAMUSCULAR | Status: DC | PRN
  Administered 2023-07-29: .2 mg via INTRAVENOUS

## 2023-07-29 MED ORDER — MIDAZOLAM HCL 2 MG/2ML IJ SOLN
INTRAMUSCULAR | Status: DC | PRN
  Administered 2023-07-29: 2 mg via INTRAVENOUS

## 2023-07-29 MED ORDER — MIDAZOLAM HCL 2 MG/2ML IJ SOLN
INTRAMUSCULAR | Status: AC
  Filled 2023-07-29: qty 2

## 2023-07-29 MED ORDER — ROCURONIUM BROMIDE 100 MG/10ML IV SOLN
INTRAVENOUS | Status: DC | PRN
  Administered 2023-07-29: 40 mg via INTRAVENOUS
  Administered 2023-07-29: 10 mg via INTRAVENOUS

## 2023-07-29 MED ORDER — LACTATED RINGERS IV SOLN: INTRAVENOUS | Status: DC | PRN

## 2023-07-29 MED ORDER — SUGAMMADEX SODIUM 200 MG/2ML IV SOLN
INTRAVENOUS | Status: DC | PRN
  Administered 2023-07-29: 200 mg via INTRAVENOUS

## 2023-07-29 MED ORDER — SUCCINYLCHOLINE CHLORIDE 200 MG/10ML IV SOSY
PREFILLED_SYRINGE | INTRAVENOUS | Status: DC | PRN
  Administered 2023-07-29: 60 mg via INTRAVENOUS

## 2023-07-29 MED ORDER — DEXAMETHASONE SODIUM PHOSPHATE 10 MG/ML IJ SOLN
INTRAMUSCULAR | Status: DC | PRN
  Administered 2023-07-29: 10 mg via INTRAVENOUS

## 2023-07-29 MED ORDER — PROPOFOL 10 MG/ML IV BOLUS
INTRAVENOUS | Status: DC | PRN
  Administered 2023-07-29: 200 mg via INTRAVENOUS

## 2023-07-29 NOTE — Anesthesia Preprocedure Evaluation (Addendum)
Anesthesia Evaluation  Patient identified by MRN, date of birth, ID band Patient awake    Reviewed: Allergy & Precautions, NPO status , Patient's Chart, lab work & pertinent test results  Airway Mallampati: III  TM Distance: >3 FB Neck ROM: full    Dental  (+) Teeth Intact   Pulmonary neg pulmonary ROS, shortness of breath and with exertion   Pulmonary exam normal  + decreased breath sounds      Cardiovascular Exercise Tolerance: Good negative cardio ROS Normal cardiovascular exam Rhythm:Regular Rate:Normal     Neuro/Psych  Headaches negative neurological ROS  negative psych ROS   GI/Hepatic negative GI ROS, Neg liver ROS,,,  Endo/Other  negative endocrine ROS    Renal/GU negative Renal ROS  negative genitourinary   Musculoskeletal   Abdominal Normal abdominal exam  (+)   Peds negative pediatric ROS (+)  Hematology negative hematology ROS (+)   Anesthesia Other Findings Past Medical History: 06/2018: Bronchiectasis ([facility]) No date: 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[facility]) No date: Dyspnea No date: Family history of adverse reaction to anesthesia     Comment:  Mom and Son got very nauseous 07/25/2013: History of mammogram     Comment:  BIRAD 2; 08/01/15 neg 08/2016: Migraine     Comment:  with aura;now also optic migraines 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 No date: Pneumonia  Past Surgical History: 1993: CESAREAN SECTION No date: COLONOSCOPY 08/25/2018: FLEXIBLE BRONCHOSCOPY; Bilateral     Comment:  Procedure: FLEXIBLE BRONCHOSCOPY WITH BAL;  Surgeon:               Vida Rigger, MD;  Location: [facility] ORS;  Service:               Thoracic;  Laterality: Bilateral; No date: Tummy tuck No date: WISDOM TOOTH EXTRACTION  BMI    Body Mass Index: 19.08 kg/m      Reproductive/Obstetrics negative OB ROS                             Anesthesia Physical Anesthesia Plan  ASA:  2  Anesthesia Plan: General   Post-op Pain Management:    Induction: Intravenous  PONV Risk Score and Plan: Ondansetron, Dexamethasone, Midazolam and Treatment may vary due to age or medical condition  Airway Management Planned: Oral ETT  Additional Equipment:   Intra-op Plan:   Post-operative Plan: Extubation in OR  Informed Consent: I have reviewed the patients History and Physical, chart, labs and discussed the procedure including the risks, benefits and alternatives for the proposed anesthesia with the patient or authorized representative who has indicated his/her understanding and acceptance.     Dental Advisory Given  Plan Discussed with: CRNA and Surgeon  Anesthesia Plan Comments:        Anesthesia Quick Evaluation

## 2023-07-29 NOTE — H&P (Signed)
Pulmonary Medicine H&P         Date: 07/29/2023,   MRN# [id number] Brittney Bailey February 28, 1966     AdmissionWeight: 54.4 kg                 CurrentWeight: 54.4 kg Brittney Bailey is a 57 y.o. old female seen in hospital for procedure post comprehensive evaluation in office     CHIEF COMPLAINT:   Pleuritic chest pain and cough   HISTORY OF PRESENT ILLNESS   This is a pleasant 57yo female who is overall healthy, she is a lifelong nonsmoker.  She was seen by infectious disease for chronic mycobacterial and fungal pulmonary infection. She was treated outpatient with development of adverse effects from antifungal therapy. .  She has had cough throuhout this period.  She denies exposure to TB/sick contacts other then common cold.  She was sent to pulmonary evaluation after failing treatment on multiple occasions.  Evaluation of CT chest was significant for bilateral bronchiectaisis worse in right middle lobe and lingular segments.  She was tested for immunoglobulin deficiency and testing was in reference range.  There is concern for active nontubercular mycobacterial infection vs indolent endemic infection and plan is to obtain washings and bronchoalveolar lavage of involved pulmonary segments.  Today we plan for bronchosopy with aspiration of mucus plugging and BAL.      -Reviewed risks/complications and benefits with patient, risks include infection, pneumothorax/pneumomediastinum which may require chest tube placement as well as overnight/prolonged hospitalization and possible mechanical ventilation. Other risks include bleeding and very rarely death.  Patient understands risks and wishes to proceed.  Additional questions were answered, and patient is aware that post procedure patient will be going home with family and may experience cough with possible clots on expectoration as well as phlegm which may last few days as well as hoarseness of voice post intubation and mechanical  ventilation.    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[facility])    Dyspnea    Family history of adverse reaction to anesthesia    Mom and Son got very nauseous   History of mammogram 07/25/2013   BIRAD 2; 08/01/15 neg   Migraine 08/2016   with aura;now also optic migraines   Mycobacterium abscessus infection    Pneumonia      SURGICAL HISTORY   Past Surgical History:  Procedure Laterality Date   CESAREAN SECTION  1993   COLONOSCOPY     FLEXIBLE BRONCHOSCOPY Bilateral 08/25/2018   Procedure: FLEXIBLE BRONCHOSCOPY WITH BAL;  Surgeon: Vida Rigger, MD;  Location: [facility] ORS;  Service: Thoracic;  Laterality: Bilateral;   Tummy tuck     WISDOM TOOTH EXTRACTION       FAMILY HISTORY   Family History  Problem Relation Age of Onset   Heart disease Father    Lung cancer Paternal Grandfather    Breast cancer Paternal Great-grandmother        11s     SOCIAL HISTORY   Social History   Tobacco Use   Smoking status: Never   Smokeless tobacco: Never  Vaping Use   Vaping status: Never Used  Substance Use Topics   Alcohol use: Yes    Comment: rarely   Drug use: Never     MEDICATIONS    Home Medication:    Current Medication:  Current Facility-Administered Medications:    lactated ringers infusion, , Intravenous, Continuous, Lenard Simmer,  MD    ALLERGIES   Patient has no known allergies.     REVIEW OF SYSTEMS    Review of Systems:  Gen:  Denies  fever, sweats, chills weigh loss  HEENT: Denies blurred vision, double vision, ear pain, eye pain, hearing loss, nose bleeds, sore throat Cardiac:  No dizziness, chest pain or heaviness, chest tightness,edema Resp:   Denies cough or sputum porduction, shortness of breath,wheezing, hemoptysis,  Gi: Denies swallowing difficulty, stomach pain, nausea or vomiting, diarrhea, constipation, bowel incontinence Gu:  Denies  bladder incontinence, burning urine Ext:   Denies Joint pain, stiffness or swelling Skin: Denies  skin rash, easy bruising or bleeding or hives Endoc:  Denies polyuria, polydipsia , polyphagia or weight change Psych:   Denies depression, insomnia or hallucinations   Other:  All other systems negative   VS: BP 138/78   Pulse 98   Temp 98.1 F (36.7 C) (Oral)   Resp 18   Ht 5' 6.5" (1.689 m)   Wt 54.4 kg   LMP 08/03/2020   SpO2 100%   BMI 19.08 kg/m      PHYSICAL EXAM  Physical Examination:   GENERAL:NAD, no fevers, chills, no weakness no fatigue HEAD: Normocephalic, atraumatic.  EYES: Pupils equal, round, reactive to light. Extraocular muscles intact. No scleral icterus.  MOUTH: Moist mucosal membrane. Dentition intact. No abscess noted.  EAR, NOSE, THROAT: Clear without exudates. No external lesions.  NECK: Supple. No thyromegaly. No nodules. No JVD.  PULMONARY: Clear to auscultation no wheezing rhonchi or crackles CARDIOVASCULAR: S1 and S2. Regular rate and rhythm. No murmurs, rubs, or gallops. No edema. Pedal pulses 2+ bilaterally.  GASTROINTESTINAL: Soft, nontender, nondistended. No masses. Positive bowel sounds. No hepatosplenomegaly.  MUSCULOSKELETAL: No swelling, clubbing, or edema. Range of motion full in all extremities.  NEUROLOGIC: Cranial nerves II through XII are intact. No gross focal neurological deficits. Sensation intact. Reflexes intact.  SKIN: No ulceration, lesions, rashes, or cyanosis. Skin warm and dry. Turgor intact.  PSYCHIATRIC: Mood, affect within normal limits. The patient is awake, alert and oriented x 3. Insight, judgment intact.       IMAGING    No results found.     ASSESSMENT/PLAN   Chronic aspergillus and mycobacterial pulmonary infection bronchiectasis - concern for active recurrent indolent infection l - Flexible bronchoscopy with BAL and bronchial washings of involved segments  - invasive specimen to be sent to micro/cyto   -therapeutic aspiration of tracheobronchial tree     -Reviewed risks/complications and benefits with patient, risks include infection, pneumothorax/pneumomediastinum which may require chest tube placement as well as overnight/prolonged hospitalization and possible mechanical ventilation. Other risks include bleeding and very rarely death.  Patient understands risks and wishes to proceed.  Additional questions were answered, and patient is aware that post procedure patient will be going home with family and may experience cough with possible clots on expectoration as well as phlegm which may last few days as well as hoarseness of voice post intubation and mechanical ventilation.   Patient/Family are satisfied with Plan of action and management. All questions answered      Vida Rigger, MD Division of Pulmonary and Critical Care Medicine 11:59 AM 07/29/2023

## 2023-07-29 NOTE — Transfer of Care (Signed)
Immediate Anesthesia Transfer of Care Note  Patient: Brittney Bailey  Procedure(s) Performed: BRONCHIAL WASHINGS FLEXIBLE BRONCHOSCOPY  Patient Location: PACU  Anesthesia Type:General  Level of Consciousness: awake, drowsy, and patient cooperative  Airway & Oxygen Therapy: Patient Spontanous Breathing and Patient connected to face mask oxygen  Post-op Assessment: Report given to RN and Post -op Vital signs reviewed and stable  Post vital signs: Reviewed and stable  Last Vitals:  Vitals Value Taken Time  BP 117/67 07/29/23 1245  Temp    Pulse 127 07/29/23 1247  Resp 14 07/29/23 1247  SpO2 94 % 07/29/23 1247  Vitals shown include unfiled device data.  Last Pain:  Vitals:   07/29/23 1124  TempSrc: Oral  PainSc: 0-No pain         Complications: No notable events documented.

## 2023-07-29 NOTE — Anesthesia Postprocedure Evaluation (Signed)
Anesthesia Post Note  Patient: Brittney Bailey  Procedure(s) Performed: BRONCHIAL WASHINGS FLEXIBLE BRONCHOSCOPY  Patient location during evaluation: PACU Anesthesia Type: General Level of consciousness: awake Pain management: pain level controlled Vital Signs Assessment: post-procedure vital signs reviewed and stable Respiratory status: spontaneous breathing Cardiovascular status: blood pressure returned to baseline Anesthetic complications: no   No notable events documented.   Last Vitals:  Vitals:   07/29/23 1315 07/29/23 1331  BP: 113/73 134/86  Pulse: (!) 107 100  Resp: (!) 22 15  Temp:  (!) 36.3 C  SpO2: 93% 94%    Last Pain:  Vitals:   07/29/23 1331  TempSrc: Temporal  PainSc: 0-No pain                 VAN STAVEREN,Tyron Manetta

## 2023-07-29 NOTE — Anesthesia Procedure Notes (Addendum)
Procedure Name: Intubation Date/Time: 07/29/2023 12:11 PM  Performed by: Mohammed Kindle, CRNAPre-anesthesia Checklist: Patient identified, Emergency Drugs available, Suction available and Patient being monitored Patient Re-evaluated:Patient Re-evaluated prior to induction Oxygen Delivery Method: Circle system utilized Preoxygenation: Pre-oxygenation with 100% oxygen Induction Type: IV induction Ventilation: Mask ventilation without difficulty Laryngoscope Size: McGrath and 3 Grade View: Grade I Tube type: Oral Number of attempts: 1 Airway Equipment and Method: Stylet Placement Confirmation: ETT inserted through vocal cords under direct vision, positive ETCO2, breath sounds checked- equal and bilateral and CO2 detector Secured at: 21 cm Tube secured with: Tape Dental Injury: Teeth and Oropharynx as per pre-operative assessment

## 2023-07-29 NOTE — Progress Notes (Signed)
Dr. Karna Christmas at bedside states patient is cleared to go home.

## 2023-07-29 NOTE — Procedures (Signed)
PROCEDURE: BRONCHOSCOPY Therapeutic Aspiration of Tracheobronchial Tree  Bronchoalveolar lavage   PROCEDURE DATE: 07/29/2023  TIME:  NAME:  Brittney Bailey  DOB:19-Jul-1966  MRN: [id number] LOC:  [facility]/None    HOSP DAY: @LENGTHOFSTAYDAYS @ CODE STATUS:        Indications/Preliminary Diagnosis:   Consent: (Place X beside choice/s below)  The benefits, risks and possible complications of the procedure were        explained to:  _x__ patient  ___ patient's family  ___ other:___________  who verbalized understanding and gave:  ___ verbal  ___x written  ___ verbal and written  ___ telephone  ___ other:________ consent.      Unable to obtain consent; procedure performed on emergent basis.     Other:       PRESEDATION ASSESSMENT: History and Physical has been performed. Patient meds and allergies have been reviewed. Presedation airway examination has been performed and documented. Baseline vital signs, sedation score, oxygenation status, and cardiac rhythm were reviewed. Patient was deemed to be in satisfactory condition to undergo the procedure.    PREMEDICATIONS:  As per anesthesia       PROCEDURE DETAILS: Timeout performed and correct patient, name, & ID confirmed. Following prep per Pulmonary policy, appropriate sedation was administered. The Bronchoscope was inserted in to oral cavity with bite block in place. Therapeutic aspiration of Tracheobronchial tree was performed.  Airway exam proceeded with findings, technical procedures, and specimen collection as noted below. At the end of exam the scope was withdrawn without incident. Impression and Plan as noted below.           Airway Prep (Place X beside choice below)   1% Transtracheal Lidocaine Anesthetization 7 cc   Patient prepped per Bronchoscopy Lab Policy       Insertion Route (Place X beside choice below)   Nasal   Oral  x Endotracheal Tube   Tracheostomy   INTRAPROCEDURE MEDICATIONS: As per  anesthesia  Medication Amt Dose  Medication Amt Dose  Lidocaine 1%  cc  Epinephrine 1:10,000 sol  cc  Xylocaine 4%  cc  Cocaine  cc   TECHNICAL PROCEDURES: (Place X beside choice below)   Procedures  Description    None     Electrocautery     Cryotherapy     Balloon Dilatation     Bronchography     Stent Placement   x  Therapeutic Aspiration RUL, RML, RLL, LUL, LLL    Laser/Argon Plasma    Brachytherapy Catheter Placement    Foreign Body Removal         SPECIMENS (Sites): (Place X beside choice below)  Specimens Description   No Specimens Obtained     Washings   x Lavage RML   Biopsies    Fine Needle Aspirates    Brushings    Sputum    FINDINGS:     Media Information  Document Information   Media Information  Document Information   Media Information  Document Information   Media Information  Document Information    ESTIMATED BLOOD LOSS: none COMPLICATIONS/RESOLUTION: none      IMPRESSION:POST-PROCEDURE DX:   Sever mucus plugging - Therapeutic aspiration performed at LUL, LLL, RUL, RLL, RML BAL at RML with Dark mucopurulent return as shown in pictorial documentation above.   RECOMMENDATION/PLAN:   Awaiting microbiology workup.  AFG, Gram stain and culture, Fungal cultures, Cell count and diff, etc     Vida Rigger, M.D.  Pulmonary & Critical Care Medicine  [facility] -  [facility]

## 2023-07-30 ENCOUNTER — Encounter: Payer: Self-pay | Admitting: Pulmonary Disease

## 2023-07-30 LAB — BODY FLUID CELL COUNT WITH DIFFERENTIAL
Eos, Fluid: 6 %
Lymphs, Fluid: 12 %
Monocyte-Macrophage-Serous Fluid: 6 % — ABNORMAL LOW (ref 50–90)
Neutrophil Count, Fluid: 76 % — ABNORMAL HIGH (ref 0–25)
Total Nucleated Cell Count, Fluid: 2820 uL — ABNORMAL HIGH (ref 0–1000)

## 2023-07-31 LAB — ACID FAST SMEAR (AFB, MYCOBACTERIA): Acid Fast Smear: NEGATIVE

## 2023-08-01 LAB — CULTURE, BAL-QUANTITATIVE W GRAM STAIN

## 2023-08-01 LAB — ASPERGILLUS ANTIGEN, BAL/SERUM: Aspergillus Ag, BAL/Serum: 0.44 {index} (ref 0.00–0.49)

## 2023-08-18 LAB — ACID FAST CULTURE WITH REFLEXED SENSITIVITIES (MYCOBACTERIA): Acid Fast Culture: POSITIVE — AB

## 2023-08-18 LAB — MYCOBACTERIA ID BY MALDI

## 2023-08-18 LAB — MTB-RIF NAA W AFB CULT, NON-SPUTUM
Acid Fast Culture: POSITIVE — AB
M tuberculosis complex: NOT DETECTED

## 2023-08-18 LAB — ACID FAST ID BY PCR AND SUSCEPTIBILITIES
M Tuberculosis Complex: NEGATIVE
M avium complex: NEGATIVE

## 2023-08-18 LAB — ACID FAST ID BY PCR
M Tuberculosis Complex: NEGATIVE
M avium complex: NEGATIVE

## 2023-08-19 LAB — CULTURE, FUNGUS WITHOUT SMEAR

## 2023-09-20 LAB — RAPID GROWER BROTH SUSCEP.
Amikacin: 4
Ciprofloxacin: 4
Clarithromycin: >16
Clofazimine: 0.25
Doxycycline: >8
Linezolid: 4
Tigecycline: 0.12
Tobramycin: 2

## 2023-09-22 LAB — ORGANISM ID BY MALDI

## 2023-10-03 ENCOUNTER — Other Ambulatory Visit: Payer: Self-pay | Admitting: Infectious Diseases

## 2023-10-03 DIAGNOSIS — A318 Other mycobacterial infections: Secondary | ICD-10-CM

## 2023-10-05 NOTE — Progress Notes (Signed)
Patient for IR PICC Placement on Thurs 10/06/23, I called and spoke with the patient on the phone and gave pre-procedure instructions. Pt was made aware to be here at 2p and check in at the new entrance. Pt stated understanding.  Called 10/05/23

## 2023-10-06 ENCOUNTER — Ambulatory Visit
Admission: RE | Admit: 2023-10-06 | Discharge: 2023-10-06 | Disposition: A | Discharge status: Home / Self Care | Payer: PRIVATE HEALTH INSURANCE | Source: Ambulatory Visit | Attending: Infectious Diseases | Admitting: Infectious Diseases

## 2023-10-06 DIAGNOSIS — A318 Other mycobacterial infections: Secondary | ICD-10-CM | POA: Diagnosis present

## 2023-10-06 MED ORDER — LIDOCAINE HCL 1 % IJ SOLN
INTRAMUSCULAR | Status: AC
  Filled 2023-10-06: qty 20

## 2023-10-06 MED ORDER — LIDOCAINE HCL 1 % IJ SOLN
2.0000 mL | Freq: Once | INTRAMUSCULAR | Status: AC
  Administered 2023-10-06: 2 mL via INTRADERMAL

## 2023-12-13 NOTE — Addendum Note (Signed)
 Encounter addended by: Karolynn Pack on: 12/13/2023 9:19 AM  Actions taken: Imaging Exam ended

## 2024-03-01 ENCOUNTER — Other Ambulatory Visit: Payer: Self-pay | Admitting: Obstetrics and Gynecology

## 2024-03-01 DIAGNOSIS — Z1231 Encounter for screening mammogram for malignant neoplasm of breast: Secondary | ICD-10-CM

## 2024-03-02 ENCOUNTER — Other Ambulatory Visit: Payer: Self-pay | Admitting: Infectious Diseases

## 2024-03-02 DIAGNOSIS — A318 Other mycobacterial infections: Secondary | ICD-10-CM

## 2024-03-07 ENCOUNTER — Ambulatory Visit
Admission: RE | Admit: 2024-03-07 | Discharge: 2024-03-07 | Disposition: A | Discharge status: Home / Self Care | Source: Ambulatory Visit | Attending: Infectious Diseases | Admitting: Infectious Diseases

## 2024-03-07 DIAGNOSIS — A318 Other mycobacterial infections: Secondary | ICD-10-CM

## 2024-03-13 ENCOUNTER — Ambulatory Visit
Admission: RE | Admit: 2024-03-13 | Discharge: 2024-03-13 | Disposition: A | Discharge status: Home / Self Care | Payer: PRIVATE HEALTH INSURANCE | Source: Ambulatory Visit | Attending: Obstetrics and Gynecology | Admitting: Obstetrics and Gynecology

## 2024-03-13 DIAGNOSIS — Z1231 Encounter for screening mammogram for malignant neoplasm of breast: Secondary | ICD-10-CM | POA: Insufficient documentation

## 2024-03-18 ENCOUNTER — Ambulatory Visit: Payer: Self-pay | Admitting: Obstetrics and Gynecology

## 2024-05-21 ENCOUNTER — Other Ambulatory Visit: Payer: Self-pay | Admitting: Pulmonary Disease

## 2024-05-21 DIAGNOSIS — J189 Pneumonia, unspecified organism: Secondary | ICD-10-CM

## 2024-05-21 DIAGNOSIS — A318 Other mycobacterial infections: Secondary | ICD-10-CM

## 2024-05-21 DIAGNOSIS — B49 Unspecified mycosis: Secondary | ICD-10-CM
